# Patient Record
Sex: Female | Born: 1985 | Hispanic: Yes | Marital: Married
Health system: Southern US, Community
[De-identification: ages and names within clinical notes are randomized; demographics above are authoritative.]

## PROBLEM LIST (undated history)

## (undated) ENCOUNTER — Encounter: Attending: Neurology | Primary: Neurology

## (undated) ENCOUNTER — Ambulatory Visit

## (undated) ENCOUNTER — Telehealth
Attending: Student in an Organized Health Care Education/Training Program | Primary: Student in an Organized Health Care Education/Training Program

## (undated) ENCOUNTER — Encounter

## (undated) ENCOUNTER — Telehealth
Attending: Pharmacist Clinician (PhC)/ Clinical Pharmacy Specialist | Primary: Pharmacist Clinician (PhC)/ Clinical Pharmacy Specialist

## (undated) ENCOUNTER — Telehealth

## (undated) ENCOUNTER — Ambulatory Visit: Attending: Physician Assistant | Primary: Physician Assistant

## (undated) ENCOUNTER — Ambulatory Visit: Attending: Internal Medicine | Primary: Internal Medicine

## (undated) ENCOUNTER — Encounter: Attending: Physician Assistant | Primary: Physician Assistant

## (undated) ENCOUNTER — Encounter
Attending: Student in an Organized Health Care Education/Training Program | Primary: Student in an Organized Health Care Education/Training Program

## (undated) ENCOUNTER — Encounter
Attending: Pharmacist Clinician (PhC)/ Clinical Pharmacy Specialist | Primary: Pharmacist Clinician (PhC)/ Clinical Pharmacy Specialist

## (undated) ENCOUNTER — Encounter: Attending: Family Medicine | Primary: Family Medicine

## (undated) ENCOUNTER — Ambulatory Visit
Payer: PRIVATE HEALTH INSURANCE | Attending: Student in an Organized Health Care Education/Training Program | Primary: Student in an Organized Health Care Education/Training Program

## (undated) ENCOUNTER — Encounter: Attending: Internal Medicine | Primary: Internal Medicine

## (undated) ENCOUNTER — Ambulatory Visit
Attending: Pharmacist Clinician (PhC)/ Clinical Pharmacy Specialist | Primary: Pharmacist Clinician (PhC)/ Clinical Pharmacy Specialist

## (undated) ENCOUNTER — Institutional Professional Consult (permissible substitution): Attending: Physician Assistant | Primary: Physician Assistant

## (undated) ENCOUNTER — Ambulatory Visit: Attending: Pharmacist | Primary: Pharmacist

## (undated) ENCOUNTER — Telehealth: Attending: Rheumatology | Primary: Rheumatology

## (undated) ENCOUNTER — Ambulatory Visit: Attending: Professional | Primary: Professional

## (undated) ENCOUNTER — Telehealth: Attending: Physician Assistant | Primary: Physician Assistant

## (undated) ENCOUNTER — Ambulatory Visit: Attending: Neurology | Primary: Neurology

## (undated) ENCOUNTER — Ambulatory Visit: Payer: PRIVATE HEALTH INSURANCE | Attending: Physician Assistant | Primary: Physician Assistant

---
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
## 2004-11-02 ENCOUNTER — Observation Stay: Payer: Self-pay

## 2004-11-03 ENCOUNTER — Inpatient Hospital Stay: Payer: Self-pay

## 2012-02-17 ENCOUNTER — Emergency Department: Payer: Self-pay | Admitting: Emergency Medicine

## 2012-02-17 LAB — COMPREHENSIVE METABOLIC PANEL
BUN: 11 mg/dL (ref 7–18)
Bilirubin,Total: 0.6 mg/dL (ref 0.2–1.0)
Calcium, Total: 8.9 mg/dL (ref 8.5–10.1)
Chloride: 105 mmol/L (ref 98–107)
Co2: 21 mmol/L (ref 21–32)
Creatinine: 0.61 mg/dL (ref 0.60–1.30)
EGFR (African American): >60
EGFR (Non-African Amer.): >60
Glucose: 118 mg/dL — ABNORMAL HIGH (ref 65–99)
Potassium: 4.1 mmol/L (ref 3.5–5.1)
SGOT(AST): 106 U/L — ABNORMAL HIGH (ref 15–37)
SGPT (ALT): 213 U/L — ABNORMAL HIGH (ref 12–78)
Sodium: 137 mmol/L (ref 136–145)
Total Protein: 7.9 g/dL (ref 6.4–8.2)

## 2012-02-17 LAB — CBC
MCH: 30.7 pg (ref 26.0–34.0)
MCHC: 34.2 g/dL (ref 32.0–36.0)
Platelet: 284 10*3/uL (ref 150–440)
RBC: 4.37 10*6/uL (ref 3.80–5.20)
RDW: 12.6 % (ref 11.5–14.5)

## 2012-02-17 LAB — URINALYSIS, COMPLETE
Bacteria: NONE SEEN
Glucose,UR: NEGATIVE mg/dL (ref 0–75)
Leukocyte Esterase: NEGATIVE
Nitrite: NEGATIVE
Protein: NEGATIVE
RBC,UR: 1 /HPF (ref 0–5)
Specific Gravity: 1.002 (ref 1.003–1.030)
WBC UR: <1 /HPF (ref 0–5)

## 2012-02-17 LAB — PREGNANCY, URINE: Pregnancy Test, Urine: NEGATIVE m[IU]/mL

## 2012-03-31 ENCOUNTER — Emergency Department: Payer: Self-pay | Admitting: Emergency Medicine

## 2012-03-31 LAB — CBC
HCT: 35.4 % (ref 35.0–47.0)
HGB: 12.4 g/dL (ref 12.0–16.0)
MCH: 31.6 pg (ref 26.0–34.0)
MCHC: 35.1 g/dL (ref 32.0–36.0)
RBC: 3.94 10*6/uL (ref 3.80–5.20)

## 2012-03-31 LAB — URINALYSIS, COMPLETE
Glucose,UR: NEGATIVE mg/dL (ref 0–75)
Nitrite: NEGATIVE
Ph: 9 (ref 4.5–8.0)
Protein: NEGATIVE
RBC,UR: 1 /HPF (ref 0–5)

## 2012-03-31 LAB — COMPREHENSIVE METABOLIC PANEL
Albumin: 3.8 g/dL (ref 3.4–5.0)
Alkaline Phosphatase: 72 U/L (ref 50–136)
Anion Gap: 8 (ref 7–16)
BUN: 6 mg/dL — ABNORMAL LOW (ref 7–18)
Bilirubin,Total: 0.4 mg/dL (ref 0.2–1.0)
Calcium, Total: 8.7 mg/dL (ref 8.5–10.1)
Chloride: 107 mmol/L (ref 98–107)
Co2: 26 mmol/L (ref 21–32)
Creatinine: 0.64 mg/dL (ref 0.60–1.30)
EGFR (African American): >60
EGFR (Non-African Amer.): >60
Glucose: 99 mg/dL (ref 65–99)
Osmolality: 279 (ref 275–301)
Potassium: 3.7 mmol/L (ref 3.5–5.1)
SGOT(AST): 36 U/L (ref 15–37)
SGPT (ALT): 73 U/L (ref 12–78)
Sodium: 141 mmol/L (ref 136–145)
Total Protein: 7 g/dL (ref 6.4–8.2)

## 2012-03-31 LAB — PREGNANCY, URINE: Pregnancy Test, Urine: POSITIVE m[IU]/mL

## 2012-03-31 LAB — HCG, QUANTITATIVE, PREGNANCY: Beta Hcg, Quant.: 54461 m[IU]/mL — ABNORMAL HIGH

## 2012-06-18 ENCOUNTER — Ambulatory Visit: Payer: Self-pay | Admitting: Family Medicine

## 2012-08-25 ENCOUNTER — Observation Stay: Payer: Self-pay | Admitting: Obstetrics and Gynecology

## 2012-08-25 LAB — URINALYSIS, COMPLETE
Bilirubin,UR: NEGATIVE
Blood: NEGATIVE
Ketone: NEGATIVE
Ph: 7 (ref 4.5–8.0)
Protein: NEGATIVE
RBC,UR: 1 /HPF (ref 0–5)
Specific Gravity: 1.012 (ref 1.003–1.030)
WBC UR: 2 /HPF (ref 0–5)

## 2012-09-24 ENCOUNTER — Observation Stay: Payer: Self-pay | Admitting: Obstetrics and Gynecology

## 2012-10-30 ENCOUNTER — Inpatient Hospital Stay: Payer: Self-pay | Admitting: Obstetrics and Gynecology

## 2012-10-30 LAB — CBC WITH DIFFERENTIAL/PLATELET
Basophil #: 0.1 10*3/uL (ref 0.0–0.1)
Basophil %: 0.9 %
Eosinophil #: 0.2 10*3/uL (ref 0.0–0.7)
Eosinophil %: 2.5 %
HCT: 32 % — ABNORMAL LOW (ref 35.0–47.0)
HGB: 11 g/dL — ABNORMAL LOW (ref 12.0–16.0)
Lymphocyte #: 1.5 10*3/uL (ref 1.0–3.6)
Lymphocyte %: 21.8 %
MCH: 28.9 pg (ref 26.0–34.0)
MCHC: 34.3 g/dL (ref 32.0–36.0)
MCV: 84 fL (ref 80–100)
Neutrophil #: 4.7 10*3/uL (ref 1.4–6.5)
Neutrophil %: 69.4 %
Platelet: 229 10*3/uL (ref 150–440)
RBC: 3.79 10*6/uL — ABNORMAL LOW (ref 3.80–5.20)

## 2012-10-31 LAB — HEMOGLOBIN: HGB: 10.8 g/dL — ABNORMAL LOW (ref 12.0–16.0)

## 2014-03-14 ENCOUNTER — Encounter: Payer: Self-pay | Admitting: Maternal & Fetal Medicine

## 2014-04-30 IMAGING — US US OB < 14 WEEKS
1 series · 14 of 28 positions shown · non-contrast
Comparison: none

REASON FOR EXAM: Abdominal pain, vaginal bleeding
COMMENTS:   May transport without cardiac monitor

[Series 1: us ob < 14 weeks · 0.23mm/px · 14 of 56 slices shown]
[im 3/56]
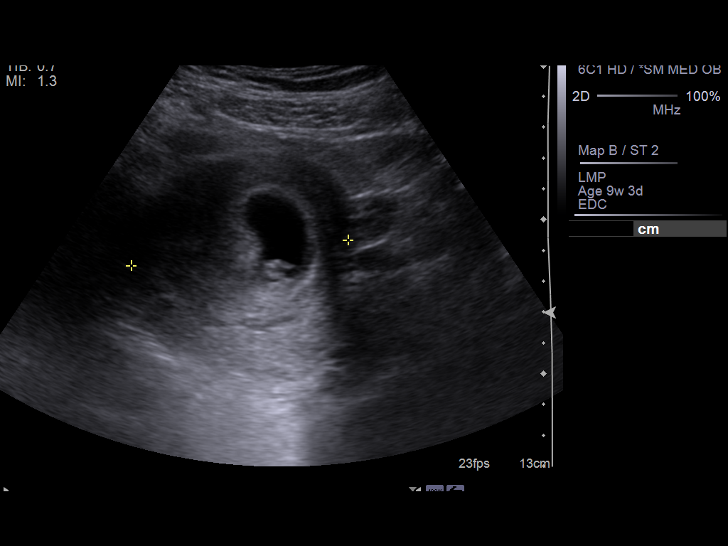
[im 7/56]
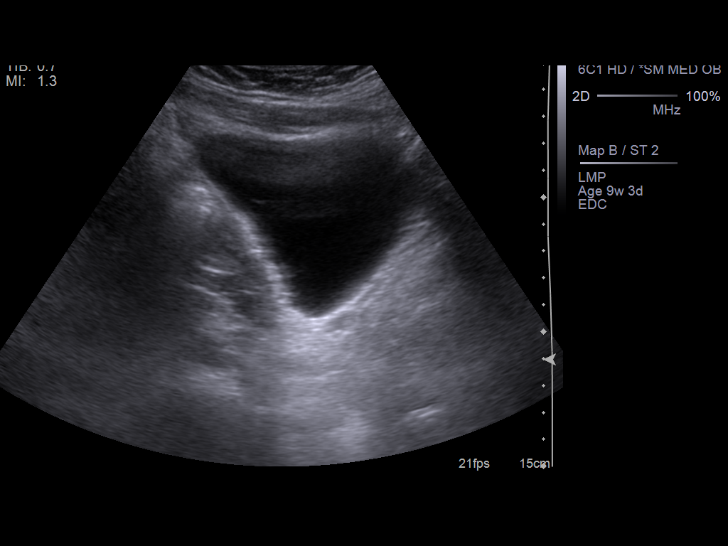
[im 11/56]
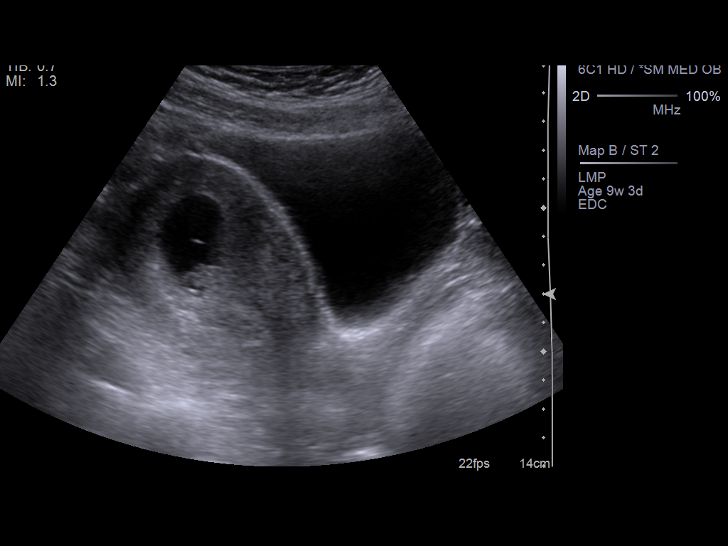
[im 15/56]
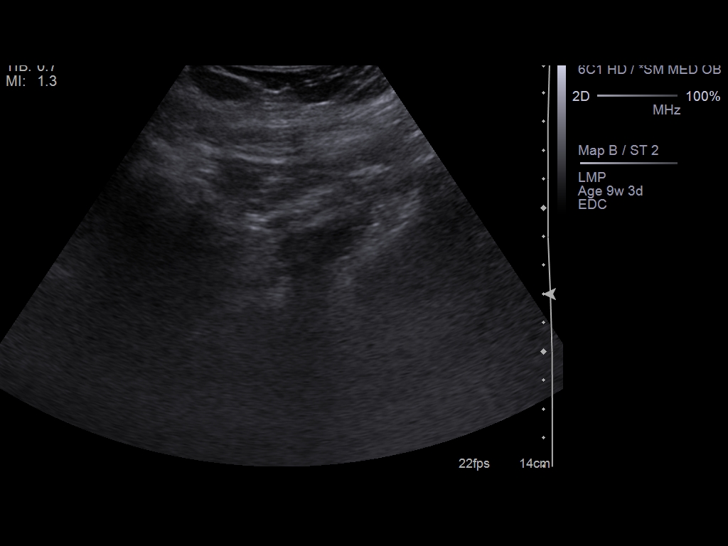
[im 19/56]
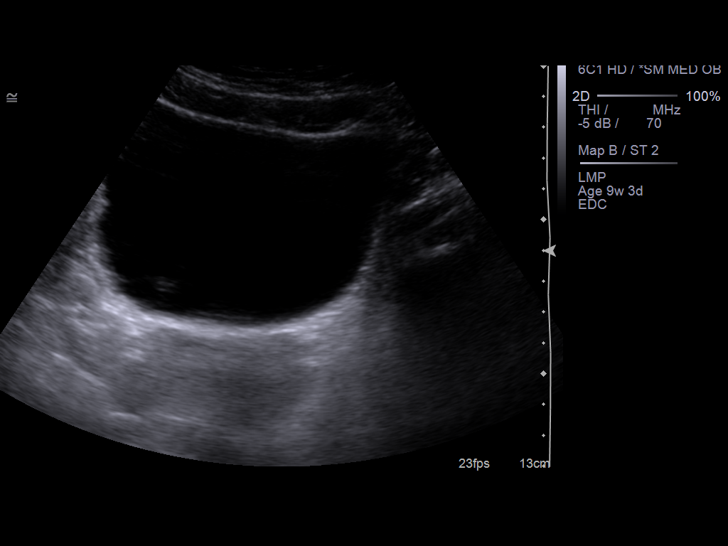
[im 23/56]
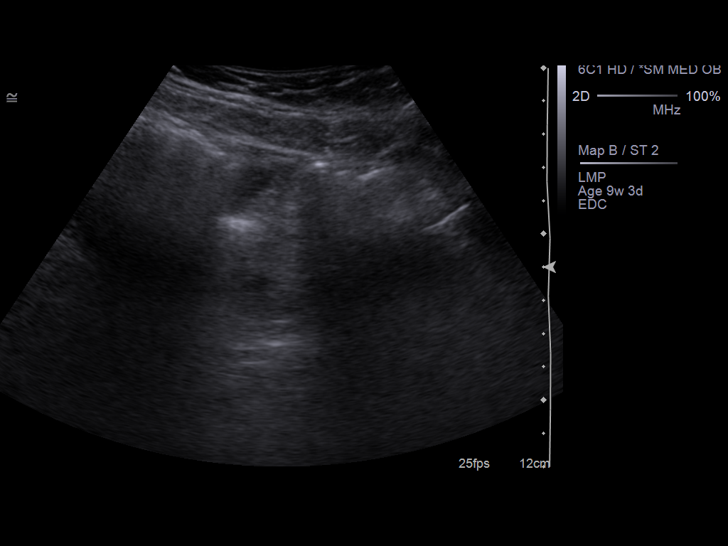
[im 27/56]
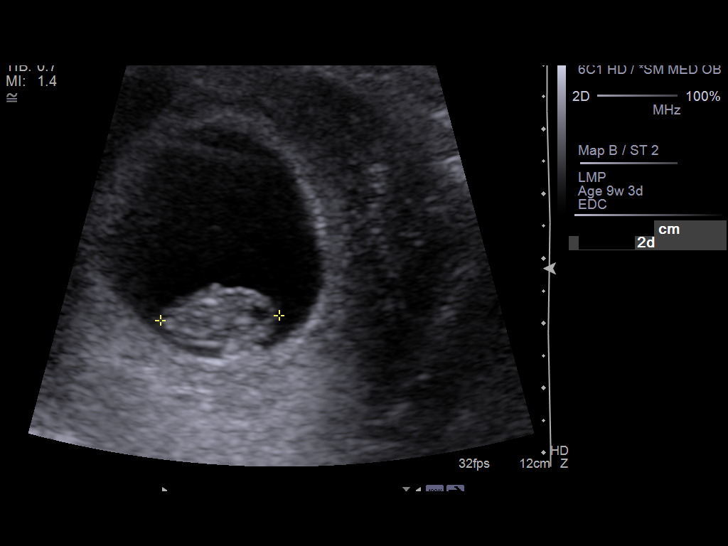
[im 31/56]
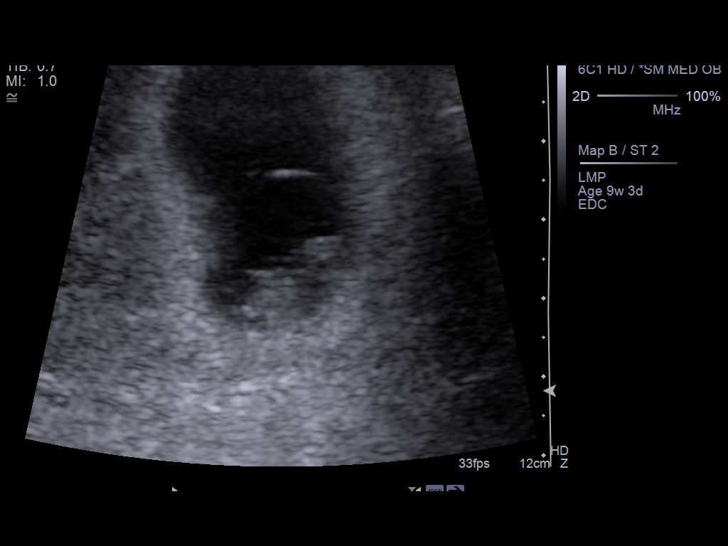
[im 35/56]
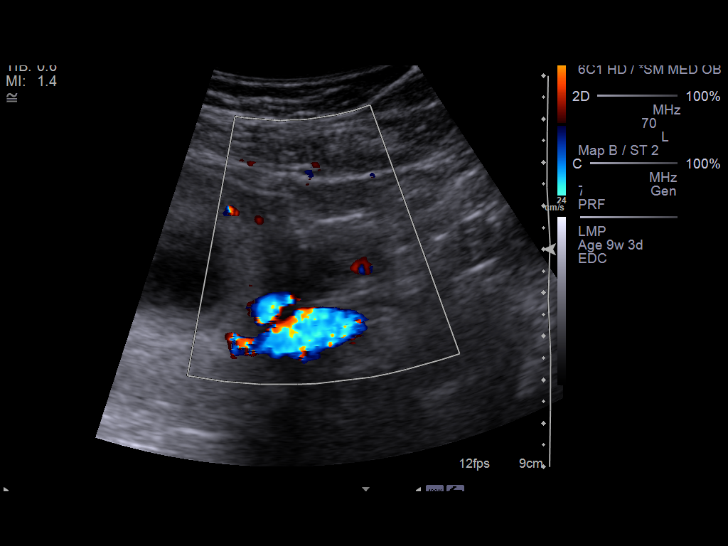
[im 39/56]
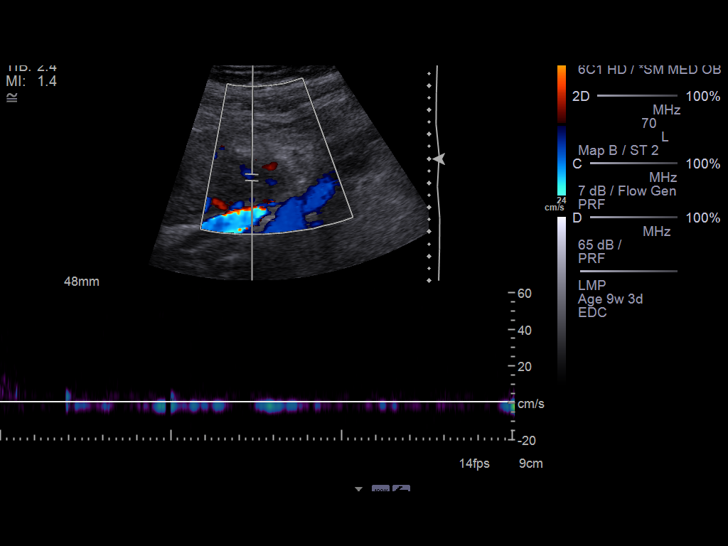
[im 43/56]
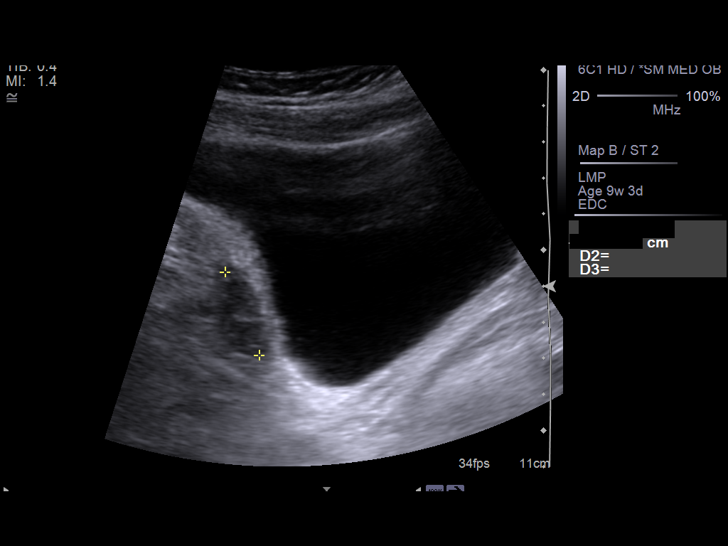
[im 47/56]
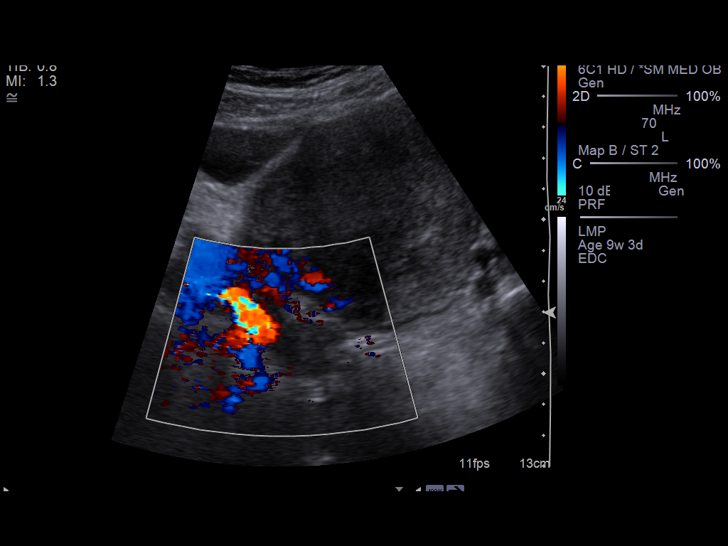
[im 51/56]
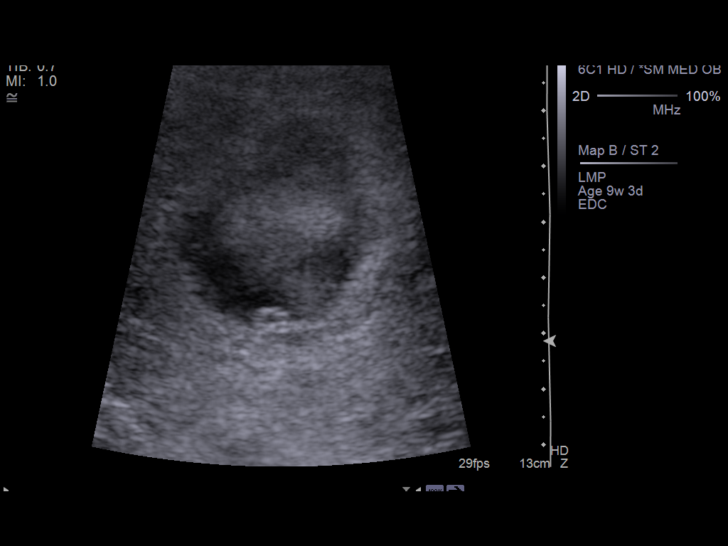
[im 56/56]
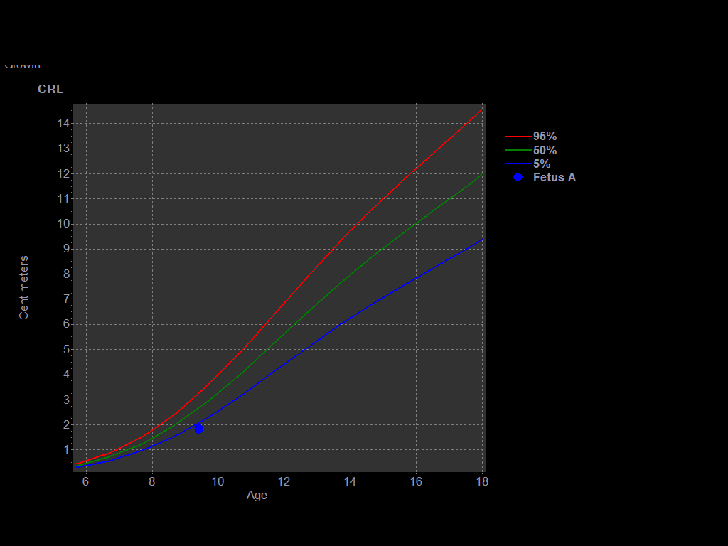

[14 of 28 positions shown; findings below may reference images not displayed]

PROCEDURE:     US  - US OB LESS THAN 14 WEEKS  - March 31, 2012  [DATE]

RESULT:     Single viable intrauterine pregnancy is present with crown-rump
length of 1.8 cm corresponding to 8 weeks 2 days. Fetal heart rate of 162
beats per minute noted. Ovaries are unremarkable. Normal ovarian blood flow.
No free pelvic fluid. No hydronephrosis.
IMPRESSION: Single viable intrauterine pregnancy.

## 2014-05-12 ENCOUNTER — Ambulatory Visit: Payer: Self-pay | Admitting: Advanced Practice Midwife

## 2014-08-16 ENCOUNTER — Ambulatory Visit: Payer: Self-pay | Admitting: Advanced Practice Midwife

## 2014-08-22 ENCOUNTER — Observation Stay: Payer: Self-pay

## 2014-08-24 ENCOUNTER — Ambulatory Visit: Payer: Self-pay | Admitting: Advanced Practice Midwife

## 2014-08-30 ENCOUNTER — Ambulatory Visit
Admit: 2014-08-30 | Disposition: A | Discharge status: Home / Self Care | Payer: Self-pay | Attending: Advanced Practice Midwife | Admitting: Advanced Practice Midwife

## 2014-08-31 ENCOUNTER — Observation Stay: Payer: Self-pay | Admitting: Obstetrics and Gynecology

## 2014-09-07 ENCOUNTER — Observation Stay: Payer: Self-pay | Admitting: Obstetrics and Gynecology

## 2014-09-09 ENCOUNTER — Ambulatory Visit: Payer: Self-pay | Admitting: Obstetrics and Gynecology

## 2014-09-11 ENCOUNTER — Inpatient Hospital Stay: Payer: Self-pay | Admitting: Obstetrics and Gynecology

## 2014-09-12 ENCOUNTER — Ambulatory Visit
Admit: 2014-09-12 | Disposition: A | Discharge status: Home / Self Care | Payer: Self-pay | Admitting: Obstetrics and Gynecology

## 2014-10-21 NOTE — Op Note (Signed)
PATIENT NAME:  Julie SabalSTEBAN Vargas, Julie Vargas MR#:  [id number] DATE OF BIRTH:  [birth year]  DATE OF PROCEDURE:  10/30/2012  DIAGNOSIS:  Severe shoulder dystocia.  PROCEDURE:  Vaginal delivery with dystocia maneuvers.   HISTORY OF PRESENT ILLNESS: A 29 year old gravida 2, para 1 patient at 3538 plus 5 weeks admitted to labor and delivery in active labor. Ultrasound the day of the procedure documented the fetus to be less than 4000 grams estimated fetal weight based on ultrasound performed the day of delivery. The patient labored adequately and entered second stage of labor. After approximately one hour of pushing, head was to the outlet presentation. A Kiwi vacuum was applied to the occiput after explaining the procedure to the patient and her husband. This was done through the translator. Delivery of the head was accomplished after 2 pulls of the vacuum, one pop-off and then a gentle second pull allowed for delivery of the head. A large fetal head then was noted to be stuck on the perineum. Pushing stopped while nursing performed a suprapubic pressure maneuver, legs were flexed with the McRoberts maneuver and multiple attempts to deliver the anterior shoulder was unsuccessful. Ultimately, a Wood's corkscrew maneuver aided in the ultimate delivery of the anterior shoulder. A small pop was heard while delivering the anterior shoulder and after delivery of a large floppy baby, nursery staff assigned Apgar scores of 5 and 9. There was a depression in the right clavicle consistent with a clavicular fracture. The baby was not moving the right arm willingly.  I explained the issues with the delivery, the maneuvers and possible fracture of the clavicle and transient neurologic condition to the patient and her spouse promptly after the delivery. The placenta delivered without difficulty and rest of the postpartum course was uneventful. The patient did receive 600 mcg of rectal Cytotec for mild uterine atony given that she had  lost IV access. The patient did receive 20 units of Pitocin intramuscular as well.  A second-degree laceration was repaired with 2-0 Vicryl and 3-0 Vicryl. The patient tolerated this well.    ____________________________ Suzy Bouchardhomas J. Brien Lowe, MD tjs:ce D: 10/30/2012 20:19:19 ET T: 10/31/2012 10:42:59 ET JOB#: [id number]  cc: Suzy Bouchardhomas J. Leron Stoffers, MD, <Dictator> Suzy BouchardHOMAS J Helem Reesor MD ELECTRONICALLY SIGNED 11/02/2012 9:28

## 2014-10-24 LAB — SURGICAL PATHOLOGY

## 2014-10-30 NOTE — Op Note (Signed)
PATIENT NAME:  Julie Vargas, Julie Vargas MR#:  [id number] DATE OF BIRTH:  07/01/1986  DATE OF PROCEDURE:  09/11/2014  PREOPERATIVE DIAGNOSES:  1.  Gestational diabetes A2, poorly controlled with an estimated fetal weight in the 96th percentile.  2.  Prior history of severe shoulder dystocia with fractured clavicle.  3.  Active labor.  4.  Rh+, group B streptococcus negative.   POSTOPERATIVE DIAGNOSES:  1.  Gestational diabetes A2, poorly controlled with an estimated fetal weight in the 96th percentile.  2.  Prior history of severe shoulder dystocia with fractured clavicle.  3.  Active labor.  4.  Rh+, group B streptococcus negative. 5.  Meconium-stained fluid.  6.  Viable female infant.   PROCEDURE: Primary low transverse cesarean section through a Pfannenstiel skin incision with placement of the On-Q pain pump.   SURGEON: Cline Cools, MD  ASSISTANT: Sharee Pimple, CNM    ANESTHESIA: General endotracheal anesthesia.   ESTIMATED BLOOD LOSS: 1000 mL.   INTRAVENOUS FLUIDS: 1800 mL.   URINE OUTPUT: 100 mL.   COMPLICATIONS OF PROCEDURE: None.    CONDITION: Stable to PACU.   INDICATION FOR PROCEDURE: The patient is a 29 year old gravida 3, para 2, 0, 0, 2, who presented at 38-4/7 weeks' estimated gestational age in active labor. She was 6 cm upon initial check. She has a history of gestational diabetes on glyburide but poorly controlled during this pregnancy with an estimated fetal weight recently up in the 96th percentile. She also has a history of prior vaginal delivery complicated by severe shoulder dystocia requiring clavicle fracture of the baby. For this reason, the plan was a scheduled cesarean section tomorrow morning under regional anesthesia; however, the patient did present today in active labor. After her initial exam, she was placed on a monitor and an urgent cesarean section was called. While the preparations were ensuing for her section, she was rechecked because of her  discomfort and found to be 9 cm. The urgency of the cesarean section did increase and the team hurried the patient back to the operating room, Of note, the patient is Spanish-speaking only. A certified medical interpreter was with the patient during these decisions and she did consent, understanding that we did not have time for an upright placement of regional anesthesia and that general anesthesia would be called for. The risks and benefits of this procedure had been previously discussed with the patient and she did accept them again, verbally at this time.   PROCEDURE: The patient was taken to the operating room, where she was identified by name and birthdate. She was placed on the operating table in supine position and prepped and draped in the usual sterile fashion. A formal timeout procedure was performed with all team members present and in agreement.   General endotracheal anesthesia was induced without difficulty; 2 grams of Ancef for prophylactic antibiotics were given prior to the start of the procedure. A Pfannenstiel skin incision was made 2 fingerbreadths above the umbilicus just after her general endotracheal anesthesia was induced. This was taken sharply down to the fascia, which was nicked in the midline and stretched laterally. The rectus muscles were then entered in the midline bluntly. The peritoneal cavity was entered in the midline bluntly. These layers were all grasped and stretched laterally to either side. A normally-sized uterus with normal tubes and ovaries were noted. Hysterotomy was performed and the membranes were ruptured for meconium-stained fluid. The fetal vertex was brought to the hysterotomy using fundal pressure. The  baby was delivered atraumatically. The cord was doubly clamped and cut and the baby was passed off to awaiting NICU staff. A 3-vessel cord was noted.   The placenta was manually extracted and the uterus was exteriorized. The uterus was cleared of all clots and  debris. Uterine atony was noted and 0.2 mg Methergine were given intramuscularly; 40 units of postpartum Pitocin was also hung. Vigorous bimanual massage was performed as the uterus was cleared of clots and debris and the hysterotomy was then closed carefully in a running locked fashion with 0 Vicryl suture. A second imbricating layer was required to assure hemostasis. Several extra figure-of-eight sutures were placed for hemostasis. It was noted that the hysterotomy appeared to be higher than expected given her advanced labor and for this reason it is not recommended that she labor again on this scar.   Interceed adhesion barrier was placed across the hysterotomy. The paracolic gutters were cleared of all clots and debris. The peritoneal cavity was cleared of clots and debris. Attention was returned to the hysterotomy, which was noted to be hemostatic and the tagged angles were cut. The fascia and the rectus muscles were hemostatic. The On-Q pump was placed with 2 catheters infraumbilically curled below the fascia. The fascia was then closed in a running fashion using 0 Vicryl suture taking care not to incorporate from underlying muscle or On-Q pump. The subcutaneous tissue was irrigated and then closed 3-0 Vicryl for a loose reapproximation, as it was greater than 2 cm thick. The skin was closed carefully with staples and a pressure dressing was placed. The On-Q pump catheters were curled under a Tegaderm and instilled with a total of 10 mL of 1% lidocaine. The general endotracheal anesthesia was reversed without difficulty and the patient is awake and stable in PACU.    ____________________________ Cline CoolsBethany E. Mikaya Bunner, MD beb:bm D: 09/11/2014 14:22:10 ET T: 09/11/2014 23:18:07 ET JOB#: [id number]  cc: Cline CoolsBethany E. Porsche Noguchi, MD, <Dictator> Cline CoolsBETHANY E Cheick Suhr MD ELECTRONICALLY SIGNED 10/04/2014 17:41

## 2017-04-03 ENCOUNTER — Ambulatory Visit
Admission: RE | Admit: 2017-04-03 | Discharge: 2017-04-03 | Disposition: A | Discharge status: Home / Self Care | Payer: MEDICAID

## 2017-04-03 DIAGNOSIS — H4710 Unspecified papilledema: Principal | ICD-10-CM

## 2017-04-04 ENCOUNTER — Inpatient Hospital Stay
Admission: EM | Admit: 2017-04-04 | Discharge: 2017-04-06 | Disposition: A | Discharge status: Home / Self Care | Payer: MEDICAID | Source: Intra-hospital

## 2017-04-04 ENCOUNTER — Inpatient Hospital Stay
Admission: EM | Admit: 2017-04-04 | Discharge: 2017-04-06 | Disposition: A | Discharge status: Home / Self Care | Payer: MEDICAID | Source: Intra-hospital | Admitting: Internal Medicine

## 2017-04-05 DIAGNOSIS — H5713 Ocular pain, bilateral: Principal | ICD-10-CM

## 2017-05-21 ENCOUNTER — Ambulatory Visit
Admission: RE | Admit: 2017-05-21 | Discharge: 2017-05-21 | Disposition: A | Discharge status: Home / Self Care | Payer: MEDICAID | Attending: Neurology | Admitting: Neurology

## 2017-05-21 DIAGNOSIS — E559 Vitamin D deficiency, unspecified: Secondary | ICD-10-CM

## 2017-05-21 DIAGNOSIS — H469 Unspecified optic neuritis: Principal | ICD-10-CM

## 2017-05-21 DIAGNOSIS — R9089 Other abnormal findings on diagnostic imaging of central nervous system: Secondary | ICD-10-CM

## 2017-05-21 MED ORDER — ERGOCALCIFEROL (VITAMIN D2) 1,250 MCG (50,000 UNIT) CAPSULE
ORAL_CAPSULE | ORAL | 0 refills | 0.00000 days | Status: CP
Start: 2017-05-21 — End: 2017-08-15

## 2017-06-27 ENCOUNTER — Ambulatory Visit
Admission: RE | Admit: 2017-06-27 | Discharge: 2017-06-27 | Disposition: A | Discharge status: Home / Self Care | Payer: MEDICAID | Attending: Neurology | Admitting: Neurology

## 2017-06-27 DIAGNOSIS — H469 Unspecified optic neuritis: Secondary | ICD-10-CM

## 2017-06-27 DIAGNOSIS — R51 Headache: Secondary | ICD-10-CM

## 2017-06-27 DIAGNOSIS — E559 Vitamin D deficiency, unspecified: Principal | ICD-10-CM

## 2017-07-10 ENCOUNTER — Ambulatory Visit: Admit: 2017-07-10 | Discharge: 2017-07-10

## 2017-07-10 DIAGNOSIS — R51 Headache: Principal | ICD-10-CM

## 2017-07-10 DIAGNOSIS — H469 Unspecified optic neuritis: Principal | ICD-10-CM

## 2017-08-06 ENCOUNTER — Ambulatory Visit: Admit: 2017-08-06 | Discharge: 2017-08-07 | Attending: Neurology | Primary: Neurology

## 2017-08-06 DIAGNOSIS — R945 Abnormal results of liver function studies: Secondary | ICD-10-CM

## 2017-08-06 DIAGNOSIS — E559 Vitamin D deficiency, unspecified: Secondary | ICD-10-CM

## 2017-08-06 DIAGNOSIS — R9089 Other abnormal findings on diagnostic imaging of central nervous system: Secondary | ICD-10-CM

## 2017-08-06 DIAGNOSIS — H469 Unspecified optic neuritis: Principal | ICD-10-CM

## 2017-08-15 ENCOUNTER — Encounter: Admit: 2017-08-15 | Discharge: 2017-08-16 | Payer: MEDICARE | Attending: Internal Medicine | Primary: Internal Medicine

## 2017-08-15 ENCOUNTER — Ambulatory Visit: Admit: 2017-08-15 | Discharge: 2017-08-16

## 2017-08-15 DIAGNOSIS — R945 Abnormal results of liver function studies: Secondary | ICD-10-CM

## 2017-08-15 DIAGNOSIS — H469 Unspecified optic neuritis: Secondary | ICD-10-CM

## 2017-08-15 DIAGNOSIS — E559 Vitamin D deficiency, unspecified: Principal | ICD-10-CM

## 2017-08-16 ENCOUNTER — Encounter: Admit: 2017-08-16 | Discharge: 2017-08-17 | Payer: MEDICARE

## 2017-08-16 DIAGNOSIS — R945 Abnormal results of liver function studies: Principal | ICD-10-CM

## 2017-08-19 ENCOUNTER — Ambulatory Visit: Admit: 2017-08-19 | Discharge: 2017-08-20 | Payer: MEDICARE

## 2017-08-19 DIAGNOSIS — R945 Abnormal results of liver function studies: Principal | ICD-10-CM

## 2017-08-25 MED ORDER — CHOLECALCIFEROL (VITAMIN D3) 100 MCG (4,000 UNIT) CAPSULE
ORAL_CAPSULE | Freq: Every day | ORAL | 11 refills | 0 days | Status: CP
Start: 2017-08-25 — End: 2017-08-15

## 2017-09-03 ENCOUNTER — Ambulatory Visit: Admit: 2017-09-03 | Discharge: 2017-09-04

## 2017-09-03 DIAGNOSIS — R945 Abnormal results of liver function studies: Principal | ICD-10-CM

## 2017-09-03 DIAGNOSIS — K760 Fatty (change of) liver, not elsewhere classified: Secondary | ICD-10-CM

## 2017-09-03 DIAGNOSIS — H469 Unspecified optic neuritis: Secondary | ICD-10-CM

## 2017-11-11 ENCOUNTER — Ambulatory Visit: Admit: 2017-11-11 | Discharge: 2017-11-12 | Attending: Neurology | Primary: Neurology

## 2017-11-11 DIAGNOSIS — E559 Vitamin D deficiency, unspecified: Secondary | ICD-10-CM

## 2017-11-11 DIAGNOSIS — H469 Unspecified optic neuritis: Principal | ICD-10-CM

## 2017-11-11 DIAGNOSIS — R9089 Other abnormal findings on diagnostic imaging of central nervous system: Secondary | ICD-10-CM

## 2017-11-11 DIAGNOSIS — R945 Abnormal results of liver function studies: Secondary | ICD-10-CM

## 2017-11-17 MED ORDER — GLATIRAMER 40 MG/ML SUBCUTANEOUS SYRINGE
INJECTION | SUBCUTANEOUS | 5 refills | 0.00000 days
Start: 2017-11-17 — End: 2018-08-18

## 2017-11-21 ENCOUNTER — Ambulatory Visit: Admit: 2017-11-21 | Discharge: 2017-11-21

## 2017-11-21 ENCOUNTER — Ambulatory Visit: Admit: 2017-11-21 | Discharge: 2017-11-21 | Attending: Internal Medicine | Primary: Internal Medicine

## 2017-11-21 DIAGNOSIS — R945 Abnormal results of liver function studies: Principal | ICD-10-CM

## 2017-11-21 DIAGNOSIS — H469 Unspecified optic neuritis: Secondary | ICD-10-CM

## 2017-12-26 ENCOUNTER — Ambulatory Visit
Admit: 2017-12-26 | Discharge: 2017-12-27 | Attending: Pharmacist Clinician (PhC)/ Clinical Pharmacy Specialist | Primary: Pharmacist Clinician (PhC)/ Clinical Pharmacy Specialist

## 2017-12-26 DIAGNOSIS — H469 Unspecified optic neuritis: Principal | ICD-10-CM

## 2018-02-13 ENCOUNTER — Ambulatory Visit: Admit: 2018-02-13 | Discharge: 2018-02-14 | Attending: Neurology | Primary: Neurology

## 2018-02-13 DIAGNOSIS — Z8669 Personal history of other diseases of the nervous system and sense organs: Secondary | ICD-10-CM

## 2018-02-13 DIAGNOSIS — R945 Abnormal results of liver function studies: Secondary | ICD-10-CM

## 2018-02-13 DIAGNOSIS — M792 Neuralgia and neuritis, unspecified: Secondary | ICD-10-CM

## 2018-02-13 DIAGNOSIS — G379 Demyelinating disease of central nervous system, unspecified: Principal | ICD-10-CM

## 2018-02-13 DIAGNOSIS — E559 Vitamin D deficiency, unspecified: Secondary | ICD-10-CM

## 2018-02-13 MED ORDER — GABAPENTIN 300 MG CAPSULE
ORAL_CAPSULE | Freq: Every evening | ORAL | 5 refills | 0.00000 days | Status: CP
Start: 2018-02-13 — End: 2018-08-18

## 2018-04-01 ENCOUNTER — Ambulatory Visit: Admit: 2018-04-01 | Discharge: 2018-04-02

## 2018-04-01 DIAGNOSIS — R945 Abnormal results of liver function studies: Principal | ICD-10-CM

## 2018-04-01 DIAGNOSIS — H469 Unspecified optic neuritis: Secondary | ICD-10-CM

## 2018-04-17 ENCOUNTER — Ambulatory Visit: Admit: 2018-04-17 | Discharge: 2018-04-18

## 2018-04-17 DIAGNOSIS — R945 Abnormal results of liver function studies: Principal | ICD-10-CM

## 2018-05-07 ENCOUNTER — Ambulatory Visit: Admit: 2018-05-07 | Discharge: 2018-05-08

## 2018-05-07 DIAGNOSIS — R945 Abnormal results of liver function studies: Principal | ICD-10-CM

## 2018-05-07 DIAGNOSIS — M2550 Pain in unspecified joint: Secondary | ICD-10-CM

## 2018-05-11 ENCOUNTER — Encounter: Admit: 2018-05-11 | Discharge: 2018-05-11 | Payer: MEDICARE | Attending: Internal Medicine | Primary: Internal Medicine

## 2018-05-11 DIAGNOSIS — R945 Abnormal results of liver function studies: Principal | ICD-10-CM

## 2018-08-18 ENCOUNTER — Ambulatory Visit: Admit: 2018-08-18 | Discharge: 2018-08-19 | Attending: Neurology | Primary: Neurology

## 2018-08-18 DIAGNOSIS — R5382 Chronic fatigue, unspecified: Secondary | ICD-10-CM

## 2018-08-18 DIAGNOSIS — G35 Multiple sclerosis: Principal | ICD-10-CM

## 2018-08-18 DIAGNOSIS — H469 Unspecified optic neuritis: Secondary | ICD-10-CM

## 2018-08-18 DIAGNOSIS — M792 Neuralgia and neuritis, unspecified: Secondary | ICD-10-CM

## 2018-08-18 DIAGNOSIS — R945 Abnormal results of liver function studies: Secondary | ICD-10-CM

## 2018-08-18 MED ORDER — GABAPENTIN 300 MG CAPSULE
ORAL_CAPSULE | Freq: Every evening | ORAL | 5 refills | 0 days | Status: CP
Start: 2018-08-18 — End: ?

## 2018-08-19 MED ORDER — GLATIRAMER 40 MG/ML SUBCUTANEOUS SYRINGE
INJECTION | SUBCUTANEOUS | 5 refills | 0.00000 days
Start: 2018-08-19 — End: ?

## 2018-08-22 MED ORDER — AMANTADINE HCL 100 MG CAPSULE
ORAL_CAPSULE | Freq: Every morning | ORAL | 5 refills | 0.00000 days | Status: CP
Start: 2018-08-22 — End: ?

## 2018-09-04 ENCOUNTER — Ambulatory Visit: Admit: 2018-09-04 | Discharge: 2018-09-05

## 2018-09-04 DIAGNOSIS — G35 Multiple sclerosis: Principal | ICD-10-CM

## 2018-10-21 ENCOUNTER — Encounter: Admit: 2018-10-21 | Discharge: 2018-10-21 | Disposition: A | Discharge status: Home / Self Care | Payer: MEDICARE

## 2018-10-21 DIAGNOSIS — M25552 Pain in left hip: Secondary | ICD-10-CM

## 2018-10-21 DIAGNOSIS — M79605 Pain in left leg: Principal | ICD-10-CM

## 2018-10-21 MED ORDER — HYDROCODONE 5 MG-ACETAMINOPHEN 325 MG TABLET
ORAL_TABLET | Freq: Four times a day (QID) | ORAL | 0 refills | 0 days | Status: CP | PRN
Start: 2018-10-21 — End: 2018-10-26

## 2018-10-21 MED ORDER — IBUPROFEN 600 MG TABLET: 600 mg | tablet | Freq: Four times a day (QID) | 0 refills | 0 days | Status: AC

## 2018-10-21 MED ORDER — PREDNISONE 20 MG TABLET
ORAL_TABLET | Freq: Every day | ORAL | 0 refills | 0 days | Status: CP
Start: 2018-10-21 — End: 2018-10-26

## 2018-10-21 MED ORDER — IBUPROFEN 600 MG TABLET
ORAL_TABLET | Freq: Four times a day (QID) | ORAL | 0 refills | 0.00000 days | Status: CP | PRN
Start: 2018-10-21 — End: 2018-10-31

## 2018-11-06 ENCOUNTER — Ambulatory Visit: Admit: 2018-11-06 | Discharge: 2018-11-07

## 2018-11-06 DIAGNOSIS — K7581 Nonalcoholic steatohepatitis (NASH): Principal | ICD-10-CM

## 2018-11-19 ENCOUNTER — Ambulatory Visit: Admit: 2018-11-19 | Discharge: 2018-11-19

## 2018-11-19 ENCOUNTER — Telehealth: Admit: 2018-11-19 | Discharge: 2018-11-19 | Attending: Rheumatology | Primary: Rheumatology

## 2018-11-19 DIAGNOSIS — M2550 Pain in unspecified joint: Principal | ICD-10-CM

## 2018-11-19 DIAGNOSIS — R945 Abnormal results of liver function studies: Secondary | ICD-10-CM

## 2018-11-19 MED ORDER — CELECOXIB 200 MG CAPSULE
ORAL_CAPSULE | Freq: Every day | ORAL | 4 refills | 0.00000 days | Status: CP | PRN
Start: 2018-11-19 — End: ?

## 2019-03-16 DIAGNOSIS — H469 Unspecified optic neuritis: Secondary | ICD-10-CM

## 2019-03-17 MED ORDER — GLATIRAMER 40 MG/ML SUBCUTANEOUS SYRINGE
INJECTION | SUBCUTANEOUS | 5 refills | 0 days
Start: 2019-03-17 — End: ?

## 2019-05-05 ENCOUNTER — Institutional Professional Consult (permissible substitution): Admit: 2019-05-05 | Discharge: 2019-05-06 | Attending: Rheumatology | Primary: Rheumatology

## 2019-05-05 DIAGNOSIS — M2550 Pain in unspecified joint: Principal | ICD-10-CM

## 2019-05-05 MED ORDER — CELECOXIB 200 MG CAPSULE
ORAL_CAPSULE | Freq: Every day | ORAL | 6 refills | 30.00000 days | Status: CP | PRN
Start: 2019-05-05 — End: ?

## 2019-06-21 DIAGNOSIS — G35 Multiple sclerosis: Principal | ICD-10-CM

## 2019-06-21 DIAGNOSIS — R5382 Chronic fatigue, unspecified: Principal | ICD-10-CM

## 2019-06-21 DIAGNOSIS — M792 Neuralgia and neuritis, unspecified: Principal | ICD-10-CM

## 2019-06-23 MED ORDER — GABAPENTIN 300 MG CAPSULE
ORAL_CAPSULE | Freq: Every evening | ORAL | 0 refills | 90.00000 days | Status: CP
Start: 2019-06-23 — End: ?

## 2019-06-23 MED ORDER — AMANTADINE HCL 100 MG CAPSULE
ORAL_CAPSULE | Freq: Every morning | ORAL | 2 refills | 30 days | Status: CP
Start: 2019-06-23 — End: ?

## 2019-08-13 ENCOUNTER — Institutional Professional Consult (permissible substitution): Admit: 2019-08-13 | Discharge: 2019-08-14 | Attending: Physician Assistant | Primary: Physician Assistant

## 2019-08-13 DIAGNOSIS — H469 Unspecified optic neuritis: Principal | ICD-10-CM

## 2019-08-13 MED ORDER — GLATIRAMER 40 MG/ML SUBCUTANEOUS SYRINGE
SUBCUTANEOUS | 5 refills | 28.00000 days
Start: 2019-08-13 — End: ?

## 2019-08-13 MED ORDER — GABAPENTIN 300 MG CAPSULE
ORAL_CAPSULE | Freq: Every evening | ORAL | 1 refills | 90.00000 days | Status: CP
Start: 2019-08-13 — End: ?

## 2019-08-13 MED ORDER — AMANTADINE HCL 100 MG CAPSULE
ORAL_CAPSULE | Freq: Every morning | ORAL | 1 refills | 90 days | Status: CP
Start: 2019-08-13 — End: ?

## 2019-09-14 ENCOUNTER — Ambulatory Visit: Admit: 2019-09-14 | Discharge: 2019-09-15

## 2019-10-27 ENCOUNTER — Ambulatory Visit: Admit: 2019-10-27 | Discharge: 2019-10-28 | Attending: Rheumatology | Primary: Rheumatology

## 2019-10-27 DIAGNOSIS — M792 Neuralgia and neuritis, unspecified: Principal | ICD-10-CM

## 2019-10-27 DIAGNOSIS — M2550 Pain in unspecified joint: Principal | ICD-10-CM

## 2019-10-27 MED ORDER — GABAPENTIN 300 MG CAPSULE
ORAL_CAPSULE | Freq: Every evening | ORAL | 3 refills | 90 days | Status: CP
Start: 2019-10-27 — End: ?

## 2019-11-04 ENCOUNTER — Ambulatory Visit: Admit: 2019-11-04 | Discharge: 2019-11-05

## 2019-11-04 DIAGNOSIS — M2550 Pain in unspecified joint: Principal | ICD-10-CM

## 2019-11-04 DIAGNOSIS — Z0000 Encounter for general adult medical examination without abnormal findings: Principal | ICD-10-CM

## 2019-11-04 DIAGNOSIS — M792 Neuralgia and neuritis, unspecified: Principal | ICD-10-CM

## 2019-11-04 MED ORDER — GABAPENTIN 300 MG CAPSULE
ORAL_CAPSULE | Freq: Every evening | ORAL | 3 refills | 90.00000 days | Status: CP
Start: 2019-11-04 — End: ?

## 2019-11-05 MED ORDER — GLIPIZIDE 10 MG TABLET
ORAL_TABLET | Freq: Every day | ORAL | 11 refills | 30 days | Status: CP
Start: 2019-11-05 — End: 2020-11-04

## 2019-11-05 MED ORDER — METFORMIN 500 MG TABLET
ORAL_TABLET | 11 refills | 0 days | Status: CP
Start: 2019-11-05 — End: ?

## 2020-04-03 DIAGNOSIS — G379 Demyelinating disease of central nervous system, unspecified: Principal | ICD-10-CM

## 2020-04-11 DIAGNOSIS — H469 Unspecified optic neuritis: Principal | ICD-10-CM

## 2020-04-13 NOTE — Unmapped (Signed)
[facility] Specialty Medication Onboarding    Specialty Medication: GLATIRAMER  Prior Authorization: Not Required   Financial Assistance: Yes - manufacture assistance approved via copay card or RX [employer]UGS (1102 bucket) as primary   Final Copay/Day Supply: $0 / 28    Insurance Restrictions: None     Notes to Pharmacist: Bill to 1102    The triage team has completed the benefits investigation and has determined that the patient is able to fill this medication at [facility]. Please contact the patient to complete the onboarding or follow up with the prescribing physician as needed.

## 2020-04-13 NOTE — Unmapped (Signed)
Refilled Copaxone for one month.  Past due , staff asked to schedule appt., message/ letter sent to patient.

## 2020-04-14 MED ORDER — GLATIRAMER 40 MG/ML SUBCUTANEOUS SYRINGE
SUBCUTANEOUS | 0 refills | 28 days | Status: CP
Start: 2020-04-14 — End: ?
  Filled 2020-05-01: qty 12, 28d supply, fill #0

## 2020-04-14 NOTE — Unmapped (Signed)
The patient declined counseling on medication administration, missed dose instructions, goals of therapy, side effects and monitoring parameters, warnings and precautions, drug/food interactions and storage, handling precautions, and disposal because they have taken the medication previously. The information in the declined sections below are for informational purposes only and was not discussed with patient.       [facility]   Patient Onboarding/Medication Counseling    [name] is a 34 y.o. female with multiple sclerosis who I am counseling today on continuation of therapy.  I am speaking to the patient.    Was a Nurse, learning disability used for this call? Yes, Spanish. Patient language is appropriate in [facility]    Verified patient's date of birth / HIPAA.    Specialty medication(s) to be sent: Neurology: glatiramer      Non-specialty medications/supplies to be sent: none      Medications not needed at this time: n/a         Copaxone (glatiramer acetate)    Medication & Administration     Dosage: Inject 1 syringe (40mg ) under the skin three times weekly.    Administration: Allow syringe to stand at room temperature for 20 minutes prior to injection, then inject under the skin of arms, abdomen, hips or thighs. Rotate sites. For 3 times weekly injection, administer medication on the same 3 days every week, at least 48 hours apart.  ??? [name] has chosen every MWF as their dosing schedule  ??? Instructions for injection:  o Remove one blister pack with syringe from the box and let stand at room temperature.  o With clean, dry hands examine the syringe. Air bubbles may be present and are normal. The liquid should be clear and colorless or slightly yellowish. If the liquid is cloudy or you see particles do not use this syringe and dispose of it in your sharps container.  o Choose your injection area and clean thoroughly with an alcohol swab; allow to air dry. (remember to rotate sites ??? do not use the same site more than once in a week)  o Holding the syringe like a pencil, remove the needle cover and discard.  o Pinch a 2 inch fold of skin between your thumb and index finger.  o Rest the heel of your hand against your skin and insert the syringe at a 90 degree angle straight into your skin.  o Once fully inserted you may release the pinch of skin.  o Hold the syringe steady and slowly depress the plunger.  o Once you have injected all of the medication you may remove the needle from your skin and dispose of the syringe in your sharps container. Do not attempt to recap the syringe.  o There may be a small amount of blood at the injection site, use a clean, dry cotton ball and press gently to the site.    Adherence/Missed dose instructions:   ??? For once daily dosing:  o Take a missed dose as soon as you remember it. If it is close to the time of your next dose, skip the dose and resume your normal schedule. Do not take 2 doses at once.   ??? For three times weekly dosing:  o Skip the missed dose and resume your normal schedule.     Goals of Therapy     Prevent or reduce the frequency of flare-ups.    Side Effects & Monitoring Parameters   ??? Injection site reaction  ??? Upset stomach/vomiting  ???  Fatigue  ??? Back pain  ??? Headache    The following side effects should be reported to the provider:  ??? Signs of an allergic reaction  ??? Signs of infection (fever, chills, sore throat, ear or sinus pain, new or worsening cough, pain with urination, mouth sores or a wound that won't heal)  ??? Chest pain or pressure  ??? Abnormal heart beat  ??? Dizziness or passing out  ??? Flushing  ??? Excessive sweating  ??? Anxiety  ??? Shortness of breath  ??? Swollen glands  ??? Swelling  ??? Pain, color or temperature change, or an indent at the injection site      Contraindications, Warnings & Precautions     ??? Hypersensitivity  ??? Lipoatrophy (rotate injection sites)    Drug/Food Interactions     ??? Medication list reviewed in Epic. The patient was instructed to inform the care team before taking any new medications or supplements. No drug interactions identified.   ??? Inactivated vaccines  o All age-appropriate vaccines should be completed at least 2 weeks prior to initiation. If vaccinated during therapy a booster should be given at least 3 months after discontinuation.  ??? Live vaccines  o Avoid use of live vaccines  o Live attenuated vaccines should not be given for at least 3 months after discontinuation    Storage, Handling Precautions, & Disposal     ??? Copaxone should be stored in the refrigerator.  o If necessary, may be stored at room temperature for up to 1 month.  ??? Used syringes should be discarded in a sharps container.      Current Medications (including OTC/herbals), Comorbidities and Allergies     Current Outpatient Medications   Medication Sig Dispense Refill   ??? amantadine HCL (SYMMETREL) 100 mg capsule Take 1 capsule (100 mg total) by mouth every morning. 90 capsule 1   ??? cholecalciferol, vitamin D3, 4,000 unit cap Take by mouth.     ??? etonogestrel (NEXPLANON) 68 mg Impl 68 mg by Subdermal route once.     ??? gabapentin (NEURONTIN) 300 MG capsule Take 1 capsule (300 mg total) by mouth nightly. 90 capsule 3   ??? glatiramer 40 mg/mL Syrg Inject 1 mL (40 mg total) under the skin 3 (three) times a week. 12 mL 0   ??? glipiZIDE (GLUCOTROL) 10 MG tablet Take 1 tablet (10 mg total) by mouth daily. 30 tablet 11   ??? metFORMIN (GLUCOPHAGE) 500 MG tablet [facility] veces al dia para una semana, despues tome dos [address], [place] veces al dia 120 tablet 11     No current facility-administered medications for this visit.       No Known Allergies    Patient Active Problem List   Diagnosis   ??? Bilateral optic neuritis   ??? Abnormal brain MRI   ??? Vitamin D deficiency   ??? Elevated LFTs   ??? History of gestational diabetes mellitus (GDM)   ??? Illiteracy   ??? Overweight   ??? Fatty liver   ??? Neuropathic pain   ??? Demyelinating disease of central nervous system, unspecified (CMS-HCC)   ??? Chronic fatigue   ??? Arthralgia   ??? Type 2 diabetes mellitus without complication, without long-term current use of insulin (CMS-HCC)   ??? Health care maintenance       Reviewed and up to date in Epic.    Appropriateness of Therapy     Is medication and dose appropriate based on diagnosis? Yes  Prescription has been clinically reviewed: Yes    Baseline Quality of Life Assessment      How many days over the past month did your multiple sclerosis  keep you from your normal activities? For example, brushing your teeth or getting up in the morning. 0    Financial Information     Medication Assistance provided: Manufacturer Assistance    Anticipated copay of $0 reviewed with patient. Verified delivery address.    Delivery Information     Scheduled delivery date: 05/01/20    Expected start date: 05/25/20    Medication will be delivered via Same Day Courier to the prescription address in [facility].  This shipment will not require a signature.      Explained the services we provide at [facility] and that each month we would call to set up refills.  Stressed importance of returning phone calls so that we could ensure they receive their medications in time each month.  Informed patient that we should be setting up refills 7-10 days prior to when they will run out of medication.  A pharmacist will reach out to perform a clinical assessment periodically.  Informed patient that a welcome packet and a drug information handout will be sent.      Patient verbalized understanding of the above information as well as how to contact the pharmacy at [phone] option 4 with any questions/concerns.  The pharmacy is open Monday through Friday 8:30am-4:30pm.  A pharmacist is available 24/7 via pager to answer any clinical questions they may have.    Patient Specific Needs     - Does the patient have any physical, cognitive, or cultural barriers? No    - Patient prefers to have medications discussed with  Patient     - Is the patient or caregiver able to read and understand education materials at a high school level or above? Yes    - Patient's primary language is  Spanish     - Is the patient high risk? No    - Does the patient require a Care Management Plan? No     - Does the patient require physician intervention or other additional services (i.e. nutrition, smoking cessation, social work)? No      Arnold Long  [facility] Specialty Pharmacist

## 2020-05-01 MED FILL — GLATIRAMER 40 MG/ML SUBCUTANEOUS SYRINGE: 28 days supply | Qty: 12 | Fill #0 | Status: AC

## 2020-05-30 DIAGNOSIS — R5382 Chronic fatigue, unspecified: Principal | ICD-10-CM

## 2020-05-30 DIAGNOSIS — G35 Multiple sclerosis: Principal | ICD-10-CM

## 2020-05-30 MED ORDER — AMANTADINE HCL 100 MG CAPSULE
ORAL_CAPSULE | Freq: Every morning | ORAL | 1 refills | 90 days | Status: CP
Start: 2020-05-30 — End: ?

## 2020-06-27 DIAGNOSIS — H469 Unspecified optic neuritis: Principal | ICD-10-CM

## 2020-06-27 NOTE — Unmapped (Signed)
[facility] Clinical Assessment & Refill Coordination Note    Tiffany Meadows, [name]: December 11, 1985  Phone: [phone] (home)     All above HIPAA information was verified with patient.     Was a Nurse, learning disability used for this call? No    Specialty Medication(s):   Neurology: glatiramer     Current Outpatient Medications   Medication Sig Dispense Refill   ??? amantadine HCL (SYMMETREL) 100 mg capsule Take 1 capsule (100 mg total) by mouth every morning. 90 capsule 1   ??? cholecalciferol, vitamin D3, 4,000 unit cap Take by mouth.     ??? etonogestrel (NEXPLANON) 68 mg Impl 68 mg by Subdermal route once.     ??? gabapentin (NEURONTIN) 300 MG capsule Take 1 capsule (300 mg total) by mouth nightly. 90 capsule 3   ??? glatiramer 40 mg/mL Syrg Inject 1 mL (40 mg total) under the skin 3 (three) times a week. 12 mL 0   ??? glipiZIDE (GLUCOTROL) 10 MG tablet Take 1 tablet (10 mg total) by mouth daily. 30 tablet 11   ??? metFORMIN (GLUCOPHAGE) 500 MG tablet [facility] veces al dia para una semana, despues tome dos [address], [place] veces al dia 120 tablet 11     No current facility-administered medications for this visit.        Changes to medications: Tiffany Meadows reports no changes at this time.    No Known Allergies    Changes to allergies: No    SPECIALTY MEDICATION ADHERENCE     glatiramer 40 mg/ml: 7 days of medicine on hand       Medication Adherence    Patient reported X missed doses in the last month: 0  Specialty Medication: glatiramer   Patient is on additional specialty medications: No  Informant: patient          Specialty medication(s) dose(s) confirmed: Regimen is correct and unchanged.     Are there any concerns with adherence? No    Adherence counseling provided? Not needed    CLINICAL MANAGEMENT AND INTERVENTION      Clinical Benefit Assessment:    Do you feel the medicine is effective or helping your condition? Yes    Clinical Benefit counseling provided? Not needed    Adverse Effects Assessment:    Are you experiencing any side effects? No    Are you experiencing difficulty administering your medicine? No    Quality of Life Assessment:    How many days over the past month did your MS  keep you from your normal activities? For example, brushing your teeth or getting up in the morning. 0    Have you discussed this with your provider? Not needed    Therapy Appropriateness:    Is therapy appropriate? Yes, therapy is appropriate and should be continued    DISEASE/MEDICATION-SPECIFIC INFORMATION      For patients on injectable medications: Patient currently has 3 doses left.  Next injection is scheduled for 06/28/20.    PATIENT SPECIFIC NEEDS     - Does the patient have any physical, cognitive, or cultural barriers? No    - Is the patient high risk? No    - Does the patient require a Care Management Plan? No     - Does the patient require physician intervention or other additional services (i.e. nutrition, smoking cessation, social work)? No      SHIPPING     Specialty Medication(s) to be Shipped:   Neurology: glatiramer  Other medication(s) to be shipped: No additional medications requested for fill at this time     Changes to insurance: No    Delivery Scheduled: Yes, Expected medication delivery date: 06/29/20.  However, Rx request for refills was sent to the provider as there are none remaining.     Medication will be delivered via Same Day Courier to the confirmed prescription address in [facility].    The patient will receive a drug information handout for each medication shipped and additional FDA Medication Guides as required.  Verified that patient has previously received a Conservation officer, historic buildings.    All of the patient's questions and concerns have been addressed.    Arnold Long   [facility] Specialty Pharmacist

## 2020-06-28 MED ORDER — GLATIRAMER 40 MG/ML SUBCUTANEOUS SYRINGE
SUBCUTANEOUS | 0 refills | 28.00000 days | Status: CP
Start: 2020-06-28 — End: 2020-07-25
  Filled 2020-06-29: qty 12, 28d supply, fill #0

## 2020-06-28 NOTE — Unmapped (Signed)
Last Visit Date: 08/13/2019  Next Visit Date: 07/11/2020    Lab Results   Component Value Date    Hep B Surface Ag Nonreactive 08/06/2017    Hep B S Ab Nonreactive (A) 11/21/2017    Hep B Core Total Ab Nonreactive 08/06/2017    Hepatitis C Ab Nonreactive 08/06/2017        No results found for this or any previous visit.      No results found for this or any previous visit.      No results found for this or any previous visit.

## 2020-06-29 MED FILL — GLATIRAMER 40 MG/ML SUBCUTANEOUS SYRINGE: 28 days supply | Qty: 12 | Fill #0 | Status: AC

## 2020-07-24 NOTE — Unmapped (Signed)
The [facility] has made a second and final attempt to reach this patient to refill the following medication:Glatiramer.      We have left voicemails on the following phone numbers: [phone] and have sent a MyChart message.    Dates contacted: 1/21, 1/24  Last scheduled delivery: 12/30    The patient may be at risk of non-compliance with this medication. The patient should call the [facility] at [phone] (option 4) to refill medication.    Tiffany Meadows   [facility] Specialty Technician

## 2020-07-25 DIAGNOSIS — H469 Unspecified optic neuritis: Principal | ICD-10-CM

## 2020-07-25 NOTE — Unmapped (Signed)
[facility] Refill Coordination Note    Specialty Medication(s) to be Shipped:   Neurology: glatiramer    Other medication(s) to be shipped: No additional medications requested for fill at this time     Tiffany Meadows, DOB: Nov 18, 1985  Phone: [phone] (home)       All above HIPAA information was verified with patient's son     Was a translator used for this call? No    Completed refill call assessment today to schedule patient's medication shipment from the [facility] [phone]).       Specialty medication(s) and dose(s) confirmed: Regimen is correct and unchanged.   Changes to medications: Tiffany Meadows reports no changes at this time.  Changes to insurance: No  Questions for the pharmacist: No    Confirmed patient received Welcome Packet with first shipment. The patient will receive a drug information handout for each medication shipped and additional FDA Medication Guides as required.       DISEASE/MEDICATION-SPECIFIC INFORMATION        N/A    SPECIALTY MEDICATION ADHERENCE     Medication Adherence    Patient reported X missed doses in the last month: 0  Specialty Medication: Glatiramer 40mg /ml  Patient is on additional specialty medications: No  Informant: child/children                Glaturamer  40 mg/ml: 10 days of medicine on hand       SHIPPING     Shipping address confirmed in Epic.     Delivery Scheduled: Yes, Expected medication delivery date: 08/01/20.  However, Rx request for refills was sent to the provider as there are none remaining.     Medication will be delivered via Same Day Courier to the prescription address in Epic WAM.    Tiffany Meadows   [facility] Specialty Technician

## 2020-07-26 MED ORDER — GLATIRAMER 40 MG/ML SUBCUTANEOUS SYRINGE
SUBCUTANEOUS | 0 refills | 28.00000 days
Start: 2020-07-26 — End: ?

## 2020-07-28 MED ORDER — GLATIRAMER 40 MG/ML SUBCUTANEOUS SYRINGE
SUBCUTANEOUS | 0 refills | 28 days | Status: CP
Start: 2020-07-28 — End: ?
  Filled 2020-08-01: qty 12, 28d supply, fill #0

## 2020-08-24 DIAGNOSIS — H469 Unspecified optic neuritis: Principal | ICD-10-CM

## 2020-08-24 NOTE — Unmapped (Signed)
Last Visit Date: 08/13/2019  Next Visit Date:10/03/2020    Lab Results   Component Value Date    Hep B Surface Ag Nonreactive 08/06/2017    Hep B S Ab Nonreactive (A) 11/21/2017    Hep B Core Total Ab Nonreactive 08/06/2017    Hepatitis C Ab Nonreactive 08/06/2017        No results found for this or any previous visit.      No results found for this or any previous visit.      No results found for this or any previous visit.

## 2020-08-24 NOTE — Unmapped (Signed)
[facility] Refill Coordination Note    Specialty Medication(s) to be Shipped:   Neurology: glatiramer    Other medication(s) to be shipped: No additional medications requested for fill at this time     Tiffany Meadows, DOB: [birth year]  Phone: [phone] (home)       All above HIPAA information was verified with patient.     Was a Nurse, learning disability used for this call? No    Completed refill call assessment today to schedule patient's medication shipment from the [facility] [phone]).       Specialty medication(s) and dose(s) confirmed: Regimen is correct and unchanged.   Changes to medications: Tiffany Meadows reports no changes at this time.  Changes to insurance: No  Questions for the pharmacist: No    Confirmed patient received Welcome Packet with first shipment. The patient will receive a drug information handout for each medication shipped and additional FDA Medication Guides as required.       DISEASE/MEDICATION-SPECIFIC INFORMATION        For patients on injectable medications: Patient currently has 4 doses left.  Next injection is scheduled for 08/25/2020.    SPECIALTY MEDICATION ADHERENCE     Medication Adherence    Patient reported X missed doses in the last month: 0  Specialty Medication: Glatiramer  Patient is on additional specialty medications: No  Patient is on more than two specialty medications: No  Any gaps in refill history greater than 2 weeks in the last 3 months: no  Demonstrates understanding of importance of adherence: yes  Informant: patient  Reliability of informant: reliable  Confirmed plan for next specialty medication refill: delivery by pharmacy  Refills needed for supportive medications: not needed                Glaturamer  40 mg/ml: 10 days of medicine on hand       SHIPPING     Shipping address confirmed in Epic.     Delivery Scheduled: Yes, Expected medication delivery date: 08/31/2020.  However, Rx request for refills was sent to the provider as there are none remaining.     Medication will be delivered via Same Day Courier to the prescription address in Epic WAM.    Tiffany Meadows   [facility] Specialty Technician

## 2020-08-25 MED ORDER — GLATIRAMER 40 MG/ML SUBCUTANEOUS SYRINGE
SUBCUTANEOUS | 0 refills | 28.00000 days | Status: CP
Start: 2020-08-25 — End: ?
  Filled 2020-08-31: qty 12, 28d supply, fill #0

## 2020-08-26 NOTE — Unmapped (Signed)
Patient is past due for clinic visit.  My chart message and letter sent to patient.  Staff asked to call and schedule.  Refill Glatiramer month by month.

## 2020-09-21 DIAGNOSIS — H469 Unspecified optic neuritis: Principal | ICD-10-CM

## 2020-09-21 NOTE — Unmapped (Signed)
[facility] Refill Coordination Note    Specialty Medication(s) to be Shipped:   Inflammatory Disorders: GLATIRAMER 40 MG/ML SYR    Other medication(s) to be shipped: No additional medications requested for fill at this time     Tiffany Meadows, DOB: 06/02/1986  Phone: [phone] (home)       All above HIPAA information was verified with patient.     Was a Nurse, learning disability used for this call? No    Completed refill call assessment today to schedule patient's medication shipment from the [facility] [phone]).       Specialty medication(s) and dose(s) confirmed: Regimen is correct and unchanged.   Changes to medications: Tiffany Meadows reports no changes at this time.  Changes to insurance: No  Questions for the pharmacist: No    Confirmed patient received a Conservation officer, historic buildings and a Surveyor, mining with first shipment. The patient will receive a drug information handout for each medication shipped and additional FDA Medication Guides as required.       DISEASE/MEDICATION-SPECIFIC INFORMATION        For patients on injectable medications: Patient currently has 12 doses left.  Next injection is scheduled for 3/25.    SPECIALTY MEDICATION ADHERENCE     Medication Adherence    Patient reported X missed doses in the last month: 0  Specialty Medication: Glatiramer  Patient is on additional specialty medications: No  Patient is on more than two specialty medications: No  Informant: patient  Confirmed plan for next specialty medication refill: delivery by pharmacy  Refills needed for supportive medications: not needed          Refill Coordination    Has the Patients' Contact Information Changed: No  Is the Shipping Address Different: No           GLATIRAMER 40 mg/ml: 28 days of medicine on hand         SHIPPING     Shipping address confirmed in Epic.     Delivery Scheduled: Yes, Expected medication delivery date: 4/1.  However, Rx request for refills was sent to the provider as there are none remaining.     Medication will be delivered via UPS to the prescription address in Epic WAM.    Jolene Schimke   [facility] Specialty Technician

## 2020-09-21 NOTE — Unmapped (Signed)
REFILL REQUEST    Last Visit Date: 08/13/2019  Next Visit Date: 10/04/2019      Last seen [facility]  Lab Results   Component Value Date    Hep B Surface Ag Nonreactive 08/06/2017    Hep B S Ab Nonreactive (A) 11/21/2017    Hep B Core Total Ab Nonreactive 08/06/2017    Hepatitis C Ab Nonreactive 08/06/2017        No results found for this or any previous visit.      No results found for this or any previous visit.      No results found for this or any previous visit.

## 2020-09-22 MED ORDER — GLATIRAMER 40 MG/ML SUBCUTANEOUS SYRINGE
SUBCUTANEOUS | 0 refills | 28.00000 days | Status: CP
Start: 2020-09-22 — End: ?
  Filled 2020-09-28: qty 12, 28d supply, fill #0

## 2020-09-22 NOTE — Unmapped (Signed)
Refill glatiramer for one more month. Last seen 08/2019.  No more refills will be provided if she is not seen for an appointment.  Schedulers asked to call and schedule.  A third my chart message and letter has been sent.

## 2020-10-04 ENCOUNTER — Ambulatory Visit: Admit: 2020-10-04 | Discharge: 2020-10-05 | Attending: Physician Assistant | Primary: Physician Assistant

## 2020-10-04 DIAGNOSIS — G379 Demyelinating disease of central nervous system, unspecified: Principal | ICD-10-CM

## 2020-10-04 NOTE — Unmapped (Signed)
[facility]  Multiple Sclerosis / Neuroimmunology Division  Adilenne Ashworth Julieanne Cotton [facility]  Physician Assistant    Phone: [phone]  Fax: [phone]      Patient Name: Tiffany Meadows   Date of Birth: Nov 10, 1985  Medical Record Number: [id number]  [address] [place]  [place] [address]     Direct entry by:  Cy Blamer, PA-C.  Supervising Physician: Dr. Desma Mcgregor, Dr. Quillian Quince, [facility] Pamala Duffel.    DATE OF VISIT: October 04, 2020    REASON FOR VISIT: Followup in the Neuroimmunology Clinic for evaluation of Multiple Sclerosis / Demyelinating Disease / or other Neurological problem. History of COVID19 infection 12/2018.    **I personally spent 45 minutes face-to-face and non-face-to-face in the care of this patient, which includes all pre, intra, and post visit time on the date of service.      Last seen by Dr. Johnnye Lana 08/18/2018.    SPANISH INTERPRETOR SERVICE WAS USED via phone:    ASSESSMENT AND PLAN:  ** Possible Multiple Sclerosis.  -Copaxone started 12/16/2017. Contine. Receives it from the [employer] PAP.  -Schedule yearly MRI of the  Brain, Cervical  and Thoracic spine with / without contrast.   **Recommend COVID19 boosters.  **Refer to PCP or urgent care for right hip pain that is reproducible on palpation. May take over the counter medication.  **Follow up in 6 months.    INTERVAL HISTORY / CHIEF COMPLAINT :  Patient reports no new neurological symptoms and no MS relapses since the last visit 08/13/2019.  Reports right hip pain for 4 days. No report of a fall.    Recieved COVID19 vaccine, Pfizer, #1 &2 12/2019. Has not received any boosters.    Receives Glatiramer 40mg /mL from [facility] Patient Assistance program for patient pick up in clinic.    PHYSICAL EXAMINATION:  GENERAL:  Alert and oriented to person, place, time and situation.    Recent and remote memory intact.      Neurological Examination: Cranial Nerves:   II, III- Pupils are equal 2 mm and reactive to light b/l.  Visual Acuity:   OD 20/20, OS 20/20.  III, IV, VI- extra ocular movements are intact, No ptosis, no nystagmus.  V- sensation of the face intact b/l.  VII- face symmetrical, no facial droop, normal facial movements with smile/grimace  VIII- Hearing grossly intact.  IX and X- symmetric palate contraction, normal gag bilaterally  XI- Full shoulder shrug bilaterally  XII- Tongue protrudes midline, full range of movements of the tongue    Motor Exam:      Muscles UEs   LEs     R L   R L   Deltoids 5/5 5/5 Hip flexors  ** 5/5   Biceps 5/5 5/5 Hip extensors ** 5/5   Triceps 5/5 5/5 Knee flexors 5/5 5/5   Hand grip 5/5 5/5 Knee extensors 5/5 5/5      Foot dorsal flexors 5/5 5/5      Foot plantar flexors 5/5 5/5      Strength limited due to hip pain,  Normal bulk and tone.  No clonus.    Reflexes R L   Brachioradialis +2 +2   Biceps +2 +2   Triceps +2 +2   Patella +2 +2   Achilles +2 +2     Negative babinski.    Sensory UEs LEs    R L R L  Light touch WNL WNL WNL WNL   Pin prick WNL WNL WNL WNL   Vibration WNL WNL WNL WNL   Proprioception   WNL WNL        Cerebellar/Coordination:  Rapid alternating movements, finger-to-nose and heel-to-shin  bilaterally demonstrates no abnormalities.    Romberg was negative with eyes closed.  Gait: Slight limp due to right hip pain. Pain is reproducible on palpation of posterior, lateral  right hip. No deformities felt. FROM of right hip but painful.  Able to tandem, heel, and toe gait.     PRIOR HISTORY: A 35 y.o. female??with no PMH who presented??in the first week of 03/2017 with vision loss bilaterally for one week with eye pain??and abnormal MRI that showed bilateral optic neuritis. The patient was placed on IV Solu-Medrol. MRI of the brain was concerning for possible demyelinating disease. The patient was seen in consultation by [facility] neurology who recommended continuing IV solumedrol.   Her vision improved significantly with IV 3 doses of Solu-Medrol. She denies prior neurological episodes and her vision recovered completely. Continues to have  residual left leg hypesthesia.   She denies miscarriages, hiccups and vomiting episodes for no clear reason.     Her diagnosis is possible MS: She is NMO-IgG and MOG-IgG negative.  Her CSF studies show mild pleocytosis with no IgG OCBs. An autoimmune panel to screen for other conditions that may give bilateral optic neuritis was unremarkable.  She has no spinal cord lesions and one small subcortical and non -specific brain MRI lesion.     The patient states she dos not qualify for [facility] and for financial reasons therefore only performed brain, C/T spine MRI at the next MRI follow-up.   ??  Diagnoses with Sacroiliitis.   Xrays showed Mild osteoarthrosis of the pubic symphysis and Mild facet joint narrowing and sclerosis is present at L4-5 and L5-S1 bilaterally.    DMD History:  Glatiramer acetate:  12/26/2017-ongoing  ????  MRI: REPORTS:  09/04/2018 MRI of the brain with / without contrast. report compared to 04/03/2018: increased T2/FLAIR signal. Several of these lesions were seen on the preceding exam, but some more not. The previous exam demonstrated significant motion. Therefore, I am uncertain if there are new lesions or just poorly visualized lesions previously. Recommend follow-up for further evaluation, and consider sedation for future exams. ?? No enhancing lesions are seen. Increased T2 signal within the retrobulbar and in the canalicular segments of the bilateral optic nerves. No demonstrable enhancement on postcontrast images.     04/03/2017: Brain MRI with and w/o contrast, report: 1. Bilateral optic neuritis.  2. Single FLAIR hyperintense focus superficial left frontal white matter. While appearance is nonspecific, presence of a white matter lesion in the setting of optic neuritis raises concern for demyelinating disease.    07/10/2017: C/T  spine MRI with and w/o contrast, report: --No convincing evidence of demyelinating lesions within the cervical or thoracic spinal cord. --Mild degenerative disc disease at C5-6 and C6-7 without high-grade canal or neural foraminal stenosis.--Bone marrow signal is diffusely mildly T1-hypointense, possibly reflective of anemia.  ??  Lumbar puncture:  ??? IgG, CSF 04/05/2017 2.4  0.5 - 7.0 mg/dL Final   ??? Albumin, CSF 04/05/2017 17.1  5.0 - 35.7 mg/dL Final   ??? Albumin, Serum 04/04/2017 4400  3,500-5,000 mg/dL Final   ??? CSF IGG Index 04/04/2017 0.5  0.3 - 0.8 Final   ??? CSF Oligoclonal Bands 04/04/2017  negative negative Final   ??? Ratio Interpretation 04/04/2017  normal ?? Final   ??? Albumin 04/04/2017 4.4  3.5 - 5.0 g/dL Final   ??? Total IgG 16/04/9603 1201  600-1,700 mg/dL Final   ??? Tube # CSF 04/05/2017 Tube 1  ?? Final   ??? Color, CSF 04/05/2017 Colorless  ?? Final   ??? Appearance, CSF 04/05/2017 Clear  ?? Final   ??? Nucleated Cells, CSF 04/05/2017 8* 0 - 5 ul Final   ??? RBC, CSF 04/05/2017 58* 0 ul Final   ??? Neutrophil %, CSF 04/05/2017 1.0  0.0 - 6.0 % Final   ??? Lymphs %, CSF 04/05/2017 85.0* 40.0 - 80.0 % Final   ??? Mono/Macrophage %, CSF 04/05/2017 14.0* 15.0 - 45.0 % Final   ??? Tube # CSF 04/05/2017 Tube 4  ?? Final   ??? Color, CSF 04/05/2017 Colorless  ?? Final   ??? Appearance, CSF 04/05/2017 Clear  ?? Final   ??? Nucleated Cells, CSF 04/05/2017 13* 0 - 5 ul Final   ??? RBC, CSF 04/05/2017 2* 0 ul Final   ??? Lymphs %, CSF 04/05/2017 98.0* 40.0 - 80.0 % Final   ??? Mono/Macrophage %, CSF 04/05/2017 2.0* 15.0 - 45.0 % Final   ??? Protein, CSF 04/05/2017 29  15 - 45 mg/dL Final   ??? Glucose, CSF 04/05/2017 86* 40 - 70 mg/dL Final   ??? CSF cytology 04/05/2017 -Mild inflammatory pleocytosis, negative for malignant cells. ?? Final   ??    REVIEW OF SYSTEMS:  A 10-systems review was performed and, unless otherwise noted, declared negative by patient.    Current Outpatient Medications   Medication Sig Dispense Refill   ??? amantadine HCL (SYMMETREL) 100 mg capsule Take 1 capsule (100 mg total) by mouth every morning. 90 capsule 1   ??? cholecalciferol, vitamin D3, 4,000 unit cap Take by mouth.     ??? etonogestrel (NEXPLANON) 68 mg Impl 68 mg by Subdermal route once.     ??? gabapentin (NEURONTIN) 300 MG capsule Take 1 capsule (300 mg total) by mouth nightly. 90 capsule 3   ??? glatiramer 40 mg/mL Syrg Inject the contents of 1 syringe (40 mg total) under the skin 3 (three) times a week. 12 mL 0   ??? glipiZIDE (GLUCOTROL) 10 MG tablet Take 1 tablet (10 mg total) by mouth daily. 30 tablet 11   ??? metFORMIN (GLUCOPHAGE) 500 MG tablet [facility] veces al dia para una semana, despues tome dos [address], [place] veces al dia 120 tablet 11     No current facility-administered medications for this visit.       Patient Active Problem List   Diagnosis   ??? Bilateral optic neuritis   ??? Abnormal brain MRI   ??? Vitamin D deficiency   ??? Elevated LFTs   ??? History of gestational diabetes mellitus (GDM)   ??? Illiteracy   ??? Overweight   ??? Fatty liver   ??? Neuropathic pain   ??? Demyelinating disease of central nervous system, unspecified (CMS-HCC)   ??? Chronic fatigue   ??? Arthralgia   ??? Type 2 diabetes mellitus without complication, without long-term current use of insulin (CMS-HCC)   ??? Health care maintenance       No Known Allergies    Social History     Socioeconomic History   ??? Marital status: Married     Spouse name: Not on file   ??? Number of children: Not on file   ??? Years of education: Not on file   ??? Highest education level:  Not on file   Occupational History   ??? Not on file   Tobacco Use   ??? Smoking status: Never Smoker   ??? Smokeless tobacco: Never Used   Substance and Sexual Activity   ??? Alcohol use: No   ??? Drug use: No   ??? Sexual activity: Yes     Partners: Male   Other Topics Concern   ??? Not on file   Social History Narrative   ??? Not on file     Social Determinants of Health     Financial Resource Strain: Not on file   Food Insecurity: Not on file   Transportation Needs: Not on file   Physical Activity: Not on file   Stress: Not on file   Social Connections: Not on file

## 2020-10-04 NOTE — Unmapped (Addendum)
It was a pleasure seeing you today. Below is your visit summary:    **You can take over the counter medications for hip pian such as Tylenol or Ibuprofen. If this does not help then go see your primary provider or urgent care.    **Receive the COVID19 boosters.  **Schedule your MRI's .  **Follow up 6 months.    Stephanie Littman Jannett Celestine PA-C, MPAS  Division of Multiple Sclerosis    **[facility] at [facility]  [address], [address]  [place], [place] [address]    Phone [phone]  Fax [phone]    [facility] is a specialty clinic and there is a need for you to have a  primary care provider  who will take care of your non-neurological health.   Please set this up if you have not already done so.      ?? In case of:  ?? a suspected relapse (new symptoms or worsening existing symptoms, lasting for >24h)  OR  ?? a need for an additional appointment for other reasons  OR   ?? if you have other questions: please contact:       [facility] Desk   Phone: [phone]      ?? If you have questions for our  pharmacist, please call:      Worthy Flank, PharmD, CPP  Phone: [phone]      ?? If you need financial assistance, please call:      Financial Assistance Unit      Phone: [phone]

## 2020-10-19 NOTE — Unmapped (Signed)
Patient has enough medication on hand, rescheduling refill call for 5/12

## 2020-11-03 DIAGNOSIS — M792 Neuralgia and neuritis, unspecified: Principal | ICD-10-CM

## 2020-11-03 MED ORDER — GABAPENTIN 300 MG CAPSULE
ORAL_CAPSULE | 0 refills | 0 days | Status: CP
Start: 2020-11-03 — End: ?

## 2020-11-05 ENCOUNTER — Ambulatory Visit: Admit: 2020-11-05 | Discharge: 2020-11-06

## 2020-11-05 MED ADMIN — gadoterate meglumine (DOTAREM) Soln 14 mL: 14 mL | INTRAVENOUS | @ 14:00:00 | Stop: 2020-11-05

## 2020-11-09 DIAGNOSIS — H469 Unspecified optic neuritis: Principal | ICD-10-CM

## 2020-11-09 NOTE — Unmapped (Signed)
Neurology Clinical Pharmacist Telephone Call    Medication: Glatiramer    Spoke with patient using spanish interpretor following potential non-adherence noted by [facility] staff. Patient confirmed that she has ~1 month supply on hand currently despite last 90-month supply shipped on 09/28/20. Patient confirmed dosing schedule. Patient denies missed doses and has family member help keep her on track with dosing schedule (MWF.) Upon further conversation, it appears when patient was switched from retail pharmacy to [facility]/mfr assistance she received medication from both pharmacies at same time period, leading to additional medication on hand. Patient checked medication on hand now and stated that expiration date was in 2023. I counseled her that current stock could be used and she can delay future shipment from [facility]. Patient verbalized understanding. No further concerns for non-adherence.     Sandria Bales, PharmD, MPA  [facility] PGY2 Ambulatory Care Pharmacy Resident     Worthy Flank, PharmD, CPP  Clinical Pharmacist, [facility]  Phone: [phone]

## 2020-11-09 NOTE — Unmapped (Signed)
Last Visit Date:10/04/2020  Next Visit Date: 04/06/2021      Last [facility]  Lab Results   Component Value Date    Hep B Surface Ag Nonreactive 08/06/2017    Hep B S Ab Nonreactive (A) 11/21/2017    Hep B Core Total Ab Nonreactive 08/06/2017    Hepatitis C Ab Nonreactive 08/06/2017        No results found for this or any previous visit.      No results found for this or any previous visit.      No results found for this or any previous visit.

## 2020-11-09 NOTE — Unmapped (Signed)
I reviewed this patient case and all documentation provided by the learner and was readily available for consultation during their interaction with the patient.  I agree with the assessment and plan listed below.    Jayleen Scaglione H Amaryah Mallen   [facility] Specialty Pharmacist

## 2020-11-09 NOTE — Unmapped (Signed)
Pt stated she has over a month supply of glatiramer remaining. Rescheduled refill call for 5/27    [facility] Clinical Assessment & Refill Coordination Note    Tiffany Meadows, [place]: 28-Aug-1985  Phone: [phone] (home)     All above HIPAA information was verified with patient.     Was a Nurse, learning disability used for this call? Yes, spanish. Patient language is appropriate in [facility]    Specialty Medication(s):   Neurology: glatiramer     Current Outpatient Medications   Medication Sig Dispense Refill   ??? amantadine HCL (SYMMETREL) 100 mg capsule Take 1 capsule (100 mg total) by mouth every morning. 90 capsule 1   ??? cholecalciferol, vitamin D3, 4,000 unit cap Take by mouth.     ??? etonogestrel (NEXPLANON) 68 mg Impl 68 mg by Subdermal route once.     ??? gabapentin (NEURONTIN) 300 MG capsule TAKE 1 CAPSULE(300 MG) BY MOUTH EVERY NIGHT 30 capsule 0   ??? glatiramer 40 mg/mL Syrg Inject the contents of 1 syringe (40 mg total) under the skin 3 (three) times a week. 12 mL 0   ??? glipiZIDE (GLUCOTROL) 10 MG tablet Take 1 tablet (10 mg total) by mouth daily. 30 tablet 11   ??? metFORMIN (GLUCOPHAGE) 500 MG tablet [facility] veces al dia para una semana, despues tome dos [address], [place] veces al dia 120 tablet 11     No current facility-administered medications for this visit.        Changes to medications: [name] reports no changes at this time.    No Known Allergies    Changes to allergies: No    SPECIALTY MEDICATION ADHERENCE     Glatiramer 40 mg: 28 days of medicine on hand       Medication Adherence    Patient reported X missed doses in the last month: 0  Specialty Medication: Glatiramer 40mg  three times weekly (MWF)  Patient is on additional specialty medications: No  Informant: patient          Specialty medication(s) dose(s) confirmed: Regimen is correct and unchanged.     Are there any concerns with adherence? No    Adherence counseling provided? Not needed    CLINICAL MANAGEMENT AND INTERVENTION      Clinical Benefit Assessment:    Do you feel the medicine is effective or helping your condition? Yes    Clinical Benefit counseling provided? Not needed    Adverse Effects Assessment:    Are you experiencing any side effects? No    Are you experiencing difficulty administering your medicine? No    Quality of Life Assessment:    How many days over the past month did your MS  keep you from your normal activities? For example, brushing your teeth or getting up in the morning. Patient declined to answer    Have you discussed this with your provider? Not needed    Acute Infection Status:    Acute infections noted within Epic:  No active infections  Patient reported infection: None    Therapy Appropriateness:    Is therapy appropriate? Yes, therapy is appropriate and should be continued    DISEASE/MEDICATION-SPECIFIC INFORMATION      For patients on injectable medications: Patient currently has 12 doses left.  Next injection is scheduled for 11/10/20.    PATIENT SPECIFIC NEEDS     - Does the patient have any physical, cognitive, or cultural barriers? No    - Is the patient  high risk? No    - Does the patient require a Care Management Plan? No     - Does the patient require physician intervention or other additional services (i.e. nutrition, smoking cessation, social work)? No      SHIPPING     Specialty Medication(s) to be Shipped:   Neurology: glatiramer    Other medication(s) to be shipped: No additional medications requested for fill at this time     Changes to insurance: No    Delivery Scheduled: Patient declined refill at this time due to having >28-day supply on hand.     The patient or caregiver noted above participated in the development of this care plan and knows that they can request review of or adjustments to the care plan at any time.      All of the patient's questions and concerns have been addressed.    Vernon Prey   [facility] Specialty Pharmacist

## 2020-11-10 MED ORDER — GLATIRAMER 40 MG/ML SUBCUTANEOUS SYRINGE
SUBCUTANEOUS | 0 refills | 28.00000 days | Status: CP
Start: 2020-11-10 — End: ?
  Filled 2020-12-07: qty 12, 28d supply, fill #0

## 2020-12-01 NOTE — Unmapped (Signed)
[facility] Refill Coordination Note    Specialty Medication(s) to be Shipped:   Neurology: glatiramer    Other medication(s) to be shipped: No additional medications requested for fill at this time     Tiffany Meadows, DOB: 04-15-1986  Phone: [phone] (home)       All above HIPAA information was verified with patient.     Was a Nurse, learning disability used for this call? No    Completed refill call assessment today to schedule patient's medication shipment from the [facility] [phone]).  All relevant notes have been reviewed.     Specialty medication(s) and dose(s) confirmed: Regimen is correct and unchanged.   Changes to medications: Tiffany Meadows reports no changes at this time.  Changes to insurance: No  New side effects reported not previously addressed with a pharmacist or physician: None reported  Questions for the pharmacist: No    Confirmed patient received a Conservation officer, historic buildings and a Surveyor, mining with first shipment. The patient will receive a drug information handout for each medication shipped and additional FDA Medication Guides as required.       DISEASE/MEDICATION-SPECIFIC INFORMATION        For patients on injectable medications: Patient currently has 6 doses left.  Next injection is scheduled for next week.    SPECIALTY MEDICATION ADHERENCE     Medication Adherence    Patient reported X missed doses in the last month: 0  Specialty Medication: Glatiramer  Patient is on additional specialty medications: No  Any gaps in refill history greater than 2 weeks in the last 3 months: no  Demonstrates understanding of importance of adherence: yes  Informant: patient  Reliability of informant: reliable  Confirmed plan for next specialty medication refill: delivery by pharmacy  Refills needed for supportive medications: not needed              Were doses missed due to medication being on hold? No    Glatiramer 40 mg/ml: 14 days of medicine on hand       REFERRAL TO PHARMACIST     Referral to the pharmacist: Not needed      [facility]     Shipping address confirmed in Epic.     Delivery Scheduled: Yes, Expected medication delivery date: 12/07/2020.     Medication will be delivered via Same Day Courier to the prescription address in Epic WAM.    [name]   [facility] Specialty Technician

## 2020-12-06 NOTE — Unmapped (Signed)
error 

## 2021-01-02 DIAGNOSIS — M792 Neuralgia and neuritis, unspecified: Principal | ICD-10-CM

## 2021-01-02 DIAGNOSIS — G35 Multiple sclerosis: Principal | ICD-10-CM

## 2021-01-02 DIAGNOSIS — R5382 Chronic fatigue, unspecified: Principal | ICD-10-CM

## 2021-01-02 DIAGNOSIS — E119 Type 2 diabetes mellitus without complications: Principal | ICD-10-CM

## 2021-01-02 MED ORDER — METFORMIN 500 MG TABLET
ORAL_TABLET | 11 refills | 0 days
Start: 2021-01-02 — End: ?

## 2021-01-02 MED ORDER — AMANTADINE HCL 100 MG CAPSULE
ORAL_CAPSULE | 1 refills | 0.00000 days
Start: 2021-01-02 — End: ?

## 2021-01-02 MED ORDER — GABAPENTIN 300 MG CAPSULE
ORAL_CAPSULE | 0 refills | 0 days
Start: 2021-01-02 — End: ?

## 2021-01-02 NOTE — Unmapped (Signed)
Last Visit Date: 10/04/2020  Next Visit Date: 04/06/2021      Last seen [facility]  Lab Results   Component Value Date    Hep B Surface Ag Nonreactive 08/06/2017    Hep B S Ab Nonreactive (A) 11/21/2017    Hep B Core Total Ab Nonreactive 08/06/2017    Hepatitis C Ab Nonreactive 08/06/2017        No results found for this or any previous visit.      No results found for this or any previous visit.      No results found for this or any previous visit.

## 2021-01-03 ENCOUNTER — Ambulatory Visit: Admit: 2021-01-03 | Discharge: 2021-01-04 | Attending: Adult Health | Primary: Adult Health

## 2021-01-03 LAB — BASIC METABOLIC PANEL
ANION GAP: 5 mmol/L (ref 5–14)
BLOOD UREA NITROGEN: 9 mg/dL (ref 9–23)
BUN / CREAT RATIO: 17
CALCIUM: 9.3 mg/dL (ref 8.7–10.4)
CHLORIDE: 103 mmol/L (ref 98–107)
CO2: 27 mmol/L (ref 20.0–31.0)
CREATININE: 0.54 mg/dL — ABNORMAL LOW
EGFR CKD-EPI (2021) FEMALE: >90 mL/min/{1.73_m2} (ref >=60)
GLUCOSE RANDOM: 180 mg/dL — ABNORMAL HIGH (ref 70–179)
POTASSIUM: 3.6 mmol/L (ref 3.4–4.8)
SODIUM: 135 mmol/L (ref 135–145)

## 2021-01-03 LAB — HEMOGLOBIN A1C
ESTIMATED AVERAGE GLUCOSE: 229 mg/dL
HEMOGLOBIN A1C: 9.6 % — ABNORMAL HIGH (ref 4.8–5.6)

## 2021-01-03 MED ORDER — GABAPENTIN 300 MG CAPSULE
ORAL_CAPSULE | Freq: Every evening | ORAL | 2 refills | 30 days | Status: CP
Start: 2021-01-03 — End: ?

## 2021-01-03 MED ORDER — METFORMIN 500 MG TABLET
ORAL_TABLET | Freq: Two times a day (BID) | ORAL | 0 refills | 90 days | Status: CP
Start: 2021-01-03 — End: 2021-04-03

## 2021-01-03 MED ORDER — AMANTADINE HCL 100 MG CAPSULE
ORAL_CAPSULE | ORAL | 1 refills | 0.00000 days | Status: CP
Start: 2021-01-03 — End: ?

## 2021-01-03 NOTE — Unmapped (Signed)
[facility] Refill Coordination Note    Specialty Medication(s) to be Shipped:   Neurology: glatiramer    Other medication(s) to be shipped: No additional medications requested for fill at this time     Tiffany Meadows, DOB: 04-28-1986  Phone: [phone] (home)       All above HIPAA information was verified with patient's family member, son.     Was a Nurse, learning disability used for this call? No    Completed refill call assessment today to schedule patient's medication shipment from the [facility] [phone]).  All relevant notes have been reviewed.     Specialty medication(s) and dose(s) confirmed: Regimen is correct and unchanged.   Changes to medications: Alianis reports no changes at this time.  Changes to insurance: No  New side effects reported not previously addressed with a pharmacist or physician: None reported  Questions for the pharmacist: No    Confirmed patient received a Conservation officer, historic buildings and a Surveyor, mining with first shipment. The patient will receive a drug information handout for each medication shipped and additional FDA Medication Guides as required.       DISEASE/MEDICATION-SPECIFIC INFORMATION        For patients on injectable medications: Patient currently has 4 doses left.  Next injection is scheduled for 7/8.    SPECIALTY MEDICATION ADHERENCE     Medication Adherence    Patient reported X missed doses in the last month: 0  Specialty Medication: Glatiramer  Patient is on additional specialty medications: No  Patient is on more than two specialty medications: No  Any gaps in refill history greater than 2 weeks in the last 3 months: no  Demonstrates understanding of importance of adherence: yes  Informant: child/children              Were doses missed due to medication being on hold? No    Glatiramer 40mg /ml: Patient has 9 days of medication on hand    REFERRAL TO PHARMACIST     Referral to the pharmacist: Not needed      [facility]     Shipping address confirmed in Epic.     Delivery Scheduled: Yes, Expected medication delivery date: 7/11.     Medication will be delivered via Same Day Courier to the prescription address in Epic WAM.    Olga Millers   [facility] Specialty Technician

## 2021-01-04 NOTE — Unmapped (Signed)
[facility] NOTE    ASSESSMENT/PLAN:    Problem List Items Addressed This Visit        Endocrine    Type 2 diabetes mellitus without complication, without long-term current use of insulin (CMS-HCC)    Relevant Medications    metFORMIN (GLUCOPHAGE) 500 MG tablet    Other Relevant Orders    Basic Metabolic Panel    Hemoglobin A1c       Other    Neuropathic pain    Relevant Medications    gabapentin (NEURONTIN) 300 MG capsule   Stable, refills provided up to 90 days. Will recheck a1c/BMP today. Emphasized importance of following up with PCP before then for further refills. Educated regarding signs and symptoms to seek further medical attention.        Spanish interpreter utilized for entirety of visit.    Chief Complaint   Patient presents with   ??? medication reflls     Metformin and gabapentin          SUBJECTIVE:    Ms. Tiffany Meadows is a 35 y.o. female that presents to Urgent Care  today regarding the following issues:    #med refill   Metformin and gabapentin   No SE or missed doses   Rarely checks BG  Denies feelings of hyper/hypoglycemia   Takes gabapentin at night for pain (MS)   Works well for her     I have reviewed the patients problem list, medical history, surgical history, laboratory history and recent hospitalizations, current medications, allergies, and social history and updated them as needed.    Review of symptoms:  Negative unless  Otherwise stated in HPI.     Ms. Tiffany Meadows  reports that she has never smoked. She has never used smokeless tobacco.    OBJECTIVE:    VITALS:   Vitals:    01/03/21 1827   BP: 111/71   Pulse: 69   Temp: 36.2 ??C (97.1 ??F)    Wt:   Wt Readings from Last 3 Encounters:   01/03/21 72.6 kg (160 lb)   10/04/20 72.5 kg (159 lb 12.8 oz)   11/04/19 69 kg (152 lb 3.2 oz)       Gen: Pleasant and cooperative in NAD, resting comfortably, appears stated age  Head: Normocephalic, atraumatic  EENT: PERRLA, EOMI, sclera clear  CV: Normal S1 S2 no m/r/g  Resp: Clear to ascultation bilaterally without adventitious sounds  GI: Soft, nontender,+BS, no palpable masses  Msk: No obvious deformities or swelling, strength 5/5 in BUE and BLE  Neuro:  A&O x 4, speech appropriate, normal gait  Skin: Warm and dry, no obvious rash or suspicious lesions present  Psych:  Mood is good, able to carry on normal conversation, with good eye contact, without evidence of anxiety or depression    LABS:  No results found for any visits on 01/03/21.    STUDIES:  No results found.    ___________________________________  CURRENT MEDS:  Current Outpatient Medications   Medication Sig Dispense Refill   ??? amantadine HCL (SYMMETREL) 100 mg capsule TAKE 1 CAPSULE(100 MG) BY MOUTH EVERY MORNING 90 capsule 1   ??? cholecalciferol, vitamin D3, 4,000 unit cap Take by mouth.     ??? etonogestreL (NEXPLANON) 68 mg Impl 68 mg by Subdermal route once.     ??? glatiramer 40 mg/mL Syrg Inject the contents of 1 syringe (40 mg total) under the skin 3 (three) times a week.  12 mL 5   ??? gabapentin (NEURONTIN) 300 MG capsule Take 1 capsule (300 mg total) by mouth at bedtime. 30 capsule 2   ??? glipiZIDE (GLUCOTROL) 10 MG tablet Take 1 tablet (10 mg total) by mouth daily. 30 tablet 11   ??? metFORMIN (GLUCOPHAGE) 500 MG tablet Take 2 tablets (1,000 mg total) by mouth 2 (two) times a day with meals. Tome [employer] veces al dia para una semana, despues tome [employer] veces al dia 360 tablet 0     No current facility-administered medications for this visit.       ___________________________________  ALLERGIES:  No Known Allergies  ------------------------    PAST MEDICAL HISTORY:   Past Medical History:   Diagnosis Date   ??? Multiple sclerosis (CMS-HCC)    ??? Vitamin D deficiency          FMURGENTCARETRACKING       Did today's visit save an ED/Direct Admission?  no    PHQ-2 Score:  PHQ-2 Total Score : 0  PHQ-9 Score:       Screening complete, no depression identified / no further action needed today                [facility]  [address] [place], [place] [address] ??? Telephone [phone] ??? Fax [phone]  [email or web address]

## 2021-01-08 DIAGNOSIS — H469 Unspecified optic neuritis: Principal | ICD-10-CM

## 2021-01-08 NOTE — Unmapped (Signed)
Tiffany Meadows 's glatiramer shipment will be delayed as a result of insufficient inventory of the drug. Waiting to receive drug from manufacturer .    I have reached out to the patient  at [phone] and left a voicemail message.  We will call the patient back to reschedule the delivery upon resolution. We have not confirmed the new delivery date.

## 2021-01-09 DIAGNOSIS — H469 Unspecified optic neuritis: Principal | ICD-10-CM

## 2021-01-10 MED ORDER — GLATIRAMER 40 MG/ML SUBCUTANEOUS SYRINGE
SUBCUTANEOUS | 1 refills | 84.00000 days | Status: CP
Start: 2021-01-10 — End: ?
  Filled 2021-01-16: qty 36, 84d supply, fill #0

## 2021-01-18 DIAGNOSIS — H469 Unspecified optic neuritis: Principal | ICD-10-CM

## 2021-02-01 NOTE — Unmapped (Signed)
Patient has enough medication on hand, rescheduling refill call 10/4

## 2021-04-11 NOTE — Unmapped (Signed)
[facility] Clinical Assessment & Refill Coordination Note    Tiffany Meadows, Tiffany Meadows: August 09, 1985  Phone: [phone] (home)     All above HIPAA information was verified with patient's family member, son, Tiffany Meadows.     Was a Nurse, learning disability used for this call? No    Specialty Medication(s):   Neurology: glatiramer     Current Outpatient Medications   Medication Sig Dispense Refill   ??? amantadine HCL (SYMMETREL) 100 mg capsule TAKE 1 CAPSULE(100 MG) BY MOUTH EVERY MORNING 90 capsule 1   ??? cholecalciferol, vitamin D3, 4,000 unit cap Take by mouth.     ??? etonogestreL (NEXPLANON) 68 mg Impl 68 mg by Subdermal route once.     ??? gabapentin (NEURONTIN) 300 MG capsule Take 1 capsule (300 mg total) by mouth at bedtime. 30 capsule 2   ??? glatiramer 40 mg/mL Syrg Inject the contents of 1 syringe (40 mg total) under the skin 3 (three) times a week. 36 mL 1   ??? glipiZIDE (GLUCOTROL) 10 MG tablet Take 1 tablet (10 mg total) by mouth daily. 30 tablet 11   ??? metFORMIN (GLUCOPHAGE) 500 MG tablet Take 2 tablets (1,000 mg total) by mouth 2 (two) times a day with meals. Tome [employer] veces al dia para una semana, despues tome [employer] veces al dia 360 tablet 0     No current facility-administered medications for this visit.        Changes to medications: Tiffany Meadows reports no changes at this time.    No Known Allergies    Changes to allergies: No    SPECIALTY MEDICATION ADHERENCE     glatiramer 40 mg/ml: 21 days of medicine on hand     Medication Adherence    Patient reported X missed doses in the last month: 0  Specialty Medication: glatiramer  Patient is on additional specialty medications: No  Informant: child/children          Specialty medication(s) dose(s) confirmed: Regimen is correct and unchanged.     Are there any concerns with adherence? No    Adherence counseling provided? Not needed    CLINICAL MANAGEMENT AND INTERVENTION      Clinical Benefit Assessment:    Do you feel the medicine is effective or helping your condition? Yes    Clinical Benefit counseling provided? Not needed    Adverse Effects Assessment:    Are you experiencing any side effects? No    Are you experiencing difficulty administering your medicine? No    Quality of Life Assessment:    Quality of Life    Rheumatology  Oncology  Dermatology  Cystic Fibrosis          How many days over the past month did your MS  keep you from your normal activities? For example, brushing your teeth or getting up in the morning. 0    Have you discussed this with your provider? Not needed    Acute Infection Status:    Acute infections noted within Epic:  No active infections  Patient reported infection: None    Therapy Appropriateness:    Is therapy appropriate and patient progressing towards therapeutic goals? Yes, therapy is appropriate and should be continued    DISEASE/MEDICATION-SPECIFIC INFORMATION      For patients on injectable medications: Patient currently has 10 doses left.  Next injection is scheduled for 04/13/21.    PATIENT SPECIFIC NEEDS     - Does the patient have any physical,  cognitive, or cultural barriers? No    - Is the patient high risk? No    - Does the patient require a Care Management Plan? No     - Does the patient require physician intervention or other additional services (i.e. nutrition, smoking cessation, social work)? No      SHIPPING     Specialty Medication(s) to be Shipped:   Neurology: glatiramer    Other medication(s) to be shipped: No additional medications requested for fill at this time     Changes to insurance: No    Delivery Scheduled: Yes, Expected medication delivery date: 04/17/21.     Medication will be delivered via Same Day Courier to the confirmed prescription address in [facility].    The patient will receive a drug information handout for each medication shipped and additional FDA Medication Guides as required.  Verified that patient has previously received a Conservation officer, historic buildings and a Surveyor, mining.    The patient or caregiver noted above participated in the development of this care plan and knows that they can request review of or adjustments to the care plan at any time.      All of the patient's questions and concerns have been addressed.    Arnold Long   [facility] Specialty Pharmacist

## 2021-04-17 MED FILL — GLATIRAMER 40 MG/ML SUBCUTANEOUS SYRINGE: SUBCUTANEOUS | 84 days supply | Qty: 36 | Fill #1

## 2021-05-15 ENCOUNTER — Ambulatory Visit: Admit: 2021-05-15 | Discharge: 2021-05-15 | Attending: Physician Assistant | Primary: Physician Assistant

## 2021-05-15 ENCOUNTER — Ambulatory Visit: Admit: 2021-05-15 | Discharge: 2021-05-15

## 2021-05-15 DIAGNOSIS — M792 Neuralgia and neuritis, unspecified: Principal | ICD-10-CM

## 2021-05-15 DIAGNOSIS — H469 Unspecified optic neuritis: Principal | ICD-10-CM

## 2021-05-15 DIAGNOSIS — G379 Demyelinating disease of central nervous system, unspecified: Principal | ICD-10-CM

## 2021-05-15 MED ORDER — GABAPENTIN 300 MG CAPSULE
ORAL_CAPSULE | Freq: Every evening | ORAL | 5 refills | 30 days | Status: CP
Start: 2021-05-15 — End: ?

## 2021-05-15 NOTE — Unmapped (Signed)
[facility] [place] [facility] at [facility]  Multiple Sclerosis / Neuroimmunology Division  Jetta Murray Julieanne Cotton [facility]  Physician Assistant    Phone: [phone]  Fax: [phone]      Patient Name: Tiffany Meadows   Date of Birth: 09-Mar-1986  Medical Record Number: [id number]  [address] [place]  [place] [address]     Direct entry by:  Cy Blamer, PA-C.  Supervising Physician: Dr. Desma Mcgregor, Dr. Quillian Quince.    DATE OF VISIT: May 15, 2021    REASON FOR VISIT: Followup in the Neuroimmunology Clinic for evaluation of Multiple Sclerosis / Demyelinating Disease / or other Neurological problem.    **I personally spent 30 minutes face-to-face and non-face-to-face in the care of this patient, which includes all pre, intra, and post visit time on the date of service.      Last seen by Dr. Johnnye Lana 08/18/2018.    SPANISH INTERPRETOR SERVICE WAS USED via phone:    ASSESSMENT AND PLAN:  ** Possible Multiple Sclerosis.  -Glitarimer started 12/16/2017. Contine.   -Reviewed  with the patient 11/05/2020 yearly MRI reports of Brain, Cervical  and Thoracic spine,  with / without contrast,  compared to 09/14/2019  which showed no changes in the brain and no spinal cord lesions.  -Schedule yearly MRI of the  Brain, Cervical  and Thoracic spine without contrast  Schedule for  10/2021.    **Vitamin D:  -Check Vitamin D 25-OH at any [facility] outpatient lab.   -Continue D3 4,000 units daily.    **Fatigue:  -Continue Amantadine.    **Arm and foot pain:  -Continue Gabapentin 300 mg at bedtime.    -Follow up in 6 months.    INTERVAL HISTORY / CHIEF COMPLAINT :  Patient reports no MS relapses / sustained progression since the last visit 10/04/2020.  Takes Gabapentin 300 mg q HS for pain foot pain and arms and headaches. Prescribed initaily by OSH.    PHYSICAL EXAMINATION:  GENERAL:  Alert and oriented to person, place, time and situation.    Recent and remote memory intact.      Neurological Examination:   Cranial Nerves:   II, III- Pupils are equal 2 mm and reactive to light b/l.  Visual Acuity:   OD 20/20, OS 20/20.  III, IV, VI- extra ocular movements are intact, No ptosis, no nystagmus.  V- sensation of the face intact b/l.  VII- face symmetrical, no facial droop, normal facial movements with smile/grimace  VIII- Hearing grossly intact.  IX and X- symmetric palate contraction, normal gag bilaterally  XI- Full shoulder shrug bilaterally  XII- Tongue protrudes midline, full range of movements of the tongue    Motor Exam:      Muscles UEs   LEs     R L   R L   Deltoids 5/5 5/5 Hip flexors  ** 5/5   Biceps 5/5 5/5 Hip extensors ** 5/5   Triceps 5/5 5/5 Knee flexors 5/5 5/5   Hand grip 5/5 5/5 Knee extensors 5/5 5/5      Foot dorsal flexors 5/5 5/5      Foot plantar flexors 5/5 5/5      Strength limited due to hip pain,  Normal bulk and tone.  No clonus.    Reflexes R L   Brachioradialis +2 +2   Biceps +2 +2   Triceps +2 +2   Patella +2 +2   Achilles +2 +  2     Negative babinski.    Sensory UEs LEs    R L R L   Light touch WNL WNL WNL WNL   Pin prick WNL WNL WNL WNL   Vibration WNL WNL WNL WNL   Proprioception   WNL WNL        Cerebellar/Coordination:  Rapid alternating movements, finger-to-nose and heel-to-shin  bilaterally demonstrates no abnormalities.    Romberg was negative with eyes closed.  Gait: Slight limp due to right hip pain. Pain is reproducible on palpation of posterior, lateral  right hip. No deformities felt. FROM of right hip but painful.  Able to tandem, heel, and toe gait.     PRIOR HISTORY: A 35 y.o. female??with no PMH who presented??in the first week of 03/2017 with vision loss bilaterally for one week with eye pain??and abnormal MRI that showed bilateral optic neuritis. The patient was placed on IV Solu-Medrol. MRI of the brain was concerning for possible demyelinating disease. The patient was seen in consultation by [place] neurology who recommended continuing IV solumedrol.   Her vision improved significantly with IV 3 doses of Solu-Medrol. She denies prior neurological episodes and her vision recovered completely. Continues to have  residual left leg hypesthesia.   She denies miscarriages, hiccups and vomiting episodes for no clear reason.     Her diagnosis is possible MS: She is NMO-IgG and MOG-IgG negative.  Her CSF studies show mild pleocytosis with no IgG OCBs. An autoimmune panel to screen for other conditions that may give bilateral optic neuritis was unremarkable.  She has no spinal cord lesions and one small subcortical and non -specific brain MRI lesion.     The patient states she dos not qualify for [facility] and for financial reasons therefore only performed brain, C/T spine MRI at the next MRI follow-up.   ??  Diagnoses with Sacroiliitis.   Xrays showed Mild osteoarthrosis of the pubic symphysis and Mild facet joint narrowing and sclerosis is present at L4-5 and L5-S1 bilaterally.    DMD History:  Glatiramer acetate:  12/26/2017-ongoing  ????  MRI: REPORTS:  09/04/2018 MRI of the brain with / without contrast. report compared to 04/03/2018: increased T2/FLAIR signal. Several of these lesions were seen on the preceding exam, but some more not. The previous exam demonstrated significant motion. Therefore, I am uncertain if there are new lesions or just poorly visualized lesions previously. Recommend follow-up for further evaluation, and consider sedation for future exams. ?? No enhancing lesions are seen. Increased T2 signal within the retrobulbar and in the canalicular segments of the bilateral optic nerves. No demonstrable enhancement on postcontrast images.     04/03/2017: Brain MRI with and w/o contrast, report: 1. Bilateral optic neuritis.  2. Single FLAIR hyperintense focus superficial left frontal white matter. While appearance is nonspecific, presence of a white matter lesion in the setting of optic neuritis raises concern for demyelinating disease.    07/10/2017: C/T  spine MRI with and w/o contrast, report: --No convincing evidence of demyelinating lesions within the cervical or thoracic spinal cord. --Mild degenerative disc disease at C5-6 and C6-7 without high-grade canal or neural foraminal stenosis.--Bone marrow signal is diffusely mildly T1-hypointense, possibly reflective of anemia.  ??  Lumbar puncture:  ??? IgG, CSF 04/05/2017 2.4  0.5 - 7.0 mg/dL Final   ??? Albumin, CSF 04/05/2017 17.1  5.0 - 35.7 mg/dL Final   ??? Albumin, Serum 04/04/2017 4400  3,500-5,000 mg/dL Final   ??? CSF IGG Index 04/04/2017 0.5  0.3 - 0.8 Final   ??? CSF Oligoclonal Bands 04/04/2017  negative negative Final   ??? Ratio Interpretation 04/04/2017  normal ?? Final   ??? Albumin 04/04/2017 4.4  3.5 - 5.0 g/dL Final   ??? Total IgG 09/81/1914 1201  600-1,700 mg/dL Final   ??? Tube # CSF 04/05/2017 Tube 1  ?? Final   ??? Color, CSF 04/05/2017 Colorless  ?? Final   ??? Appearance, CSF 04/05/2017 Clear  ?? Final   ??? Nucleated Cells, CSF 04/05/2017 8* 0 - 5 ul Final   ??? RBC, CSF 04/05/2017 58* 0 ul Final   ??? Neutrophil %, CSF 04/05/2017 1.0  0.0 - 6.0 % Final   ??? Lymphs %, CSF 04/05/2017 85.0* 40.0 - 80.0 % Final   ??? Mono/Macrophage %, CSF 04/05/2017 14.0* 15.0 - 45.0 % Final   ??? Tube # CSF 04/05/2017 Tube 4  ?? Final   ??? Color, CSF 04/05/2017 Colorless  ?? Final   ??? Appearance, CSF 04/05/2017 Clear  ?? Final   ??? Nucleated Cells, CSF 04/05/2017 13* 0 - 5 ul Final   ??? RBC, CSF 04/05/2017 2* 0 ul Final   ??? Lymphs %, CSF 04/05/2017 98.0* 40.0 - 80.0 % Final   ??? Mono/Macrophage %, CSF 04/05/2017 2.0* 15.0 - 45.0 % Final   ??? Protein, CSF 04/05/2017 29  15 - 45 mg/dL Final   ??? Glucose, CSF 04/05/2017 86* 40 - 70 mg/dL Final   ??? CSF cytology 04/05/2017 -Mild inflammatory pleocytosis, negative for malignant cells. ?? Final   ??    REVIEW OF SYSTEMS:  A 10-systems review was performed and, unless otherwise noted, declared negative by patient.    Current Outpatient Medications   Medication Sig Dispense Refill   ??? amantadine HCL (SYMMETREL) 100 mg capsule TAKE 1 CAPSULE(100 MG) BY MOUTH EVERY MORNING 90 capsule 1   ??? cholecalciferol, vitamin D3, 4,000 unit cap Take by mouth.     ??? etonogestreL (NEXPLANON) 68 mg Impl 68 mg by Subdermal route once.     ??? gabapentin (NEURONTIN) 300 MG capsule Take 1 capsule (300 mg total) by mouth at bedtime. 30 capsule 2   ??? glatiramer 40 mg/mL Syrg Inject the contents of 1 syringe (40 mg total) under the skin 3 (three) times a week. 36 mL 1   ??? glipiZIDE (GLUCOTROL) 10 MG tablet Take 1 tablet (10 mg total) by mouth daily. 30 tablet 11   ??? metFORMIN (GLUCOPHAGE) 500 MG tablet Take 2 tablets (1,000 mg total) by mouth 2 (two) times a day with meals. Tome [employer] veces al dia para una semana, despues tome [employer] veces al dia 360 tablet 0     No current facility-administered medications for this visit.       Patient Active Problem List   Diagnosis   ??? Bilateral optic neuritis   ??? Abnormal brain MRI   ??? Vitamin D deficiency   ??? Elevated LFTs   ??? History of gestational diabetes mellitus (GDM)   ??? Illiteracy   ??? Overweight   ??? Fatty liver   ??? Neuropathic pain   ??? Demyelinating disease of central nervous system, unspecified (CMS-HCC)   ??? Chronic fatigue   ??? Arthralgia   ??? Type 2 diabetes mellitus without complication, without long-term current use of insulin (CMS-HCC)   ??? Health care maintenance       No Known Allergies    Social History     Socioeconomic History   ???  Marital status: Married   Tobacco Use   ??? Smoking status: Never   ??? Smokeless tobacco: Never   Substance and Sexual Activity   ??? Alcohol use: No   ??? Drug use: No   ??? Sexual activity: Yes     Partners: Male

## 2021-05-15 NOTE — Unmapped (Addendum)
Below is your visit summary:    **Have labs drawn today.    [facility] Outpatient lab  [facility]   [address] [place], [place] [address]  Monday - Friday 8 am - 5 pm    **Schedule your MRI's 10/2021. Someone will call you to schedule these.    **Follow up 6 months.   The [place] Home Modification [employer] program for individuals living with MS is now open through December 28, 2021 (flyer attached). Grants are based on meeting eligibility criteria and the availability of funds at the time of a completed application. Please note that these funds can be used for air conditioning.     Link to details and the online application: [place] Modification Grant. MS Navigators are standing by at [phone] to answer questions and support your patients through the application process.    Navigating the Challenges of MS: MS Navigator??   National MS Society: MS Navigator at [phone]  Finding answers and making decisions relies on having the right information at the right time. MS Navigator are highly skilled, compassionate professionals available Monday through Friday, 9:00 a.m. to 7:00 p.m. ET to connect you to the information, resources and support needed to move your life forward. These supportive partners help navigate the challenges of MS unique to your situation.    Miracle Mongillo Jannett Celestine PA-C, MPAS  Division of Multiple Sclerosis    **NEW ADDRESS [facility] at [facility]  [address]  [place], [place] [address]    Phone [phone]  Fax [phone]    [facility] Neurology MS Clinic is a specialty clinic and there is a need for you to have a  primary care provider  who will take care of your non-neurological health.   Please set this up if you have not already done so.      In case of:  a suspected relapse (new symptoms or worsening existing symptoms, lasting for >24h)  OR  a need for an additional appointment for other reasons  OR   if you have other questions: please contact:       [facility] Neurology [facility] Desk   Phone: [phone]      If you have questions for our  pharmacist, please call:      Worthy Flank, PharmD, CPP  Phone: [phone]      If you need financial assistance, please call:      Financial Assistance Unit      Phone: [phone]

## 2021-05-16 MED ORDER — GLATIRAMER 40 MG/ML SUBCUTANEOUS SYRINGE
SUBCUTANEOUS | 1 refills | 84 days | Status: CP
Start: 2021-05-16 — End: ?
  Filled 2021-07-26: qty 36, 84d supply, fill #0

## 2021-05-18 LAB — VITAMIN D 25 HYDROXY: VITAMIN D, TOTAL (25OH): 29.5 ng/mL (ref 20.0–80.0)

## 2021-07-24 NOTE — Unmapped (Signed)
[facility] Refill Coordination Note    Specialty Medication(s) to be Shipped:   Neurology: glatiramer    Other medication(s) to be shipped: No additional medications requested for fill at this time     Tiffany Meadows, DOB: September 15, 1985  Phone: [phone] (home)       All above HIPAA information was verified with patient's family member, son-Tiffany Meadows.     Was a Nurse, learning disability used for this call? No    Completed refill call assessment today to schedule patient's medication shipment from the [facility] [phone]).  All relevant notes have been reviewed.     Specialty medication(s) and dose(s) confirmed: Regimen is correct and unchanged.   Changes to medications: [name] reports no changes at this time.  Changes to insurance: No  New side effects reported not previously addressed with a pharmacist or physician: None reported   Questions for the pharmacist: No    Confirmed patient received a Conservation officer, historic buildings and a Surveyor, mining with first shipment. The patient will receive a drug information handout for each medication shipped and additional FDA Medication Guides as required.       DISEASE/MEDICATION-SPECIFIC INFORMATION        N/A    SPECIALTY MEDICATION ADHERENCE     Medication Adherence    Patient reported X missed doses in the last month: 0  Specialty Medication: glatiramer 40mg /ml  Patient is on additional specialty medications: No  Patient is on more than two specialty medications: No  Any gaps in refill history greater than 2 weeks in the last 3 months: no  Demonstrates understanding of importance of adherence: yes  Informant: patient  Reliability of informant: reliable  Provider-estimated medication adherence level: good  Patient is at risk for Non-Adherence: No  Reasons for non-adherence: no problems identified  Confirmed plan for next specialty medication refill: delivery by pharmacy  Refills needed for supportive medications: not needed          Refill Coordination    Has the Patients' Contact Information Changed: No  Is the Shipping Address Different: No         Were doses missed due to medication being on hold? No    Glatiramer 40 mg/ml: 14 days of medicine on hand         REFERRAL TO PHARMACIST     Referral to the pharmacist: Not needed      [facility]     Shipping address confirmed in Epic.     Delivery Scheduled: Yes, Expected medication delivery date: 01/26.     Medication will be delivered via Same Day Courier to the prescription address in Epic WAM.    Antonietta Barcelona   [facility] Specialty Technician

## 2021-07-24 NOTE — Unmapped (Signed)
The [facility] has made a third and final attempt to reach this patient to refill the following medication:Glatiramer.      We have left voicemails on the following phone numbers: [phone]; [phone] and have sent a MyChart message.    Dates contacted: 1/13, 1/17, 1/24  Last scheduled delivery: 10/18    The patient may be at risk of non-compliance with this medication. The patient should call the [facility] at [phone]  Option 4, then Option 2 (all other specialty patients) to refill medication.    Tiffany Meadows   [facility] Specialty Technician

## 2021-08-21 NOTE — Unmapped (Signed)
Patient has enough medication on hand, rescheduling refill call for 4/6

## 2021-08-25 ENCOUNTER — Emergency Department: Admit: 2021-08-25 | Discharge: 2021-08-26 | Disposition: A | Discharge status: Home / Self Care | Payer: MEDICARE

## 2021-08-25 ENCOUNTER — Ambulatory Visit: Admit: 2021-08-25 | Discharge: 2021-08-26 | Disposition: A | Discharge status: Home / Self Care | Payer: MEDICARE

## 2021-08-25 DIAGNOSIS — G35 Multiple sclerosis: Principal | ICD-10-CM

## 2021-08-25 LAB — CBC W/ AUTO DIFF
BASOPHILS ABSOLUTE COUNT: 0 10*9/L (ref 0.0–0.1)
BASOPHILS RELATIVE PERCENT: 0.4 %
EOSINOPHILS ABSOLUTE COUNT: 0.2 10*9/L (ref 0.0–0.5)
EOSINOPHILS RELATIVE PERCENT: 3 %
HEMATOCRIT: 39 % (ref 34.0–44.0)
HEMOGLOBIN: 13.5 g/dL (ref 11.3–14.9)
LYMPHOCYTES ABSOLUTE COUNT: 1.9 10*9/L (ref 1.1–3.6)
LYMPHOCYTES RELATIVE PERCENT: 23.6 %
MEAN CORPUSCULAR HEMOGLOBIN CONC: 34.5 g/dL (ref 32.0–36.0)
MEAN CORPUSCULAR HEMOGLOBIN: 30 pg (ref 25.9–32.4)
MEAN CORPUSCULAR VOLUME: 86.9 fL (ref 77.6–95.7)
MEAN PLATELET VOLUME: 9.3 fL (ref 6.8–10.7)
MONOCYTES ABSOLUTE COUNT: 0.4 10*9/L (ref 0.3–0.8)
MONOCYTES RELATIVE PERCENT: 4.6 %
NEUTROPHILS ABSOLUTE COUNT: 5.5 10*9/L (ref 1.8–7.8)
NEUTROPHILS RELATIVE PERCENT: 68.4 %
PLATELET COUNT: 321 10*9/L (ref 150–450)
RED BLOOD CELL COUNT: 4.49 10*12/L (ref 3.95–5.13)
RED CELL DISTRIBUTION WIDTH: 13.3 % (ref 12.2–15.2)
WBC ADJUSTED: 8.1 10*9/L (ref 3.6–11.2)

## 2021-08-25 LAB — COMPREHENSIVE METABOLIC PANEL
ALBUMIN: 4.3 g/dL (ref 3.4–5.0)
ALKALINE PHOSPHATASE: 91 U/L (ref 46–116)
ALT (SGPT): 74 U/L — ABNORMAL HIGH (ref 10–49)
ANION GAP: 7 mmol/L (ref 3–11)
AST (SGOT): 40 U/L — ABNORMAL HIGH (ref <34)
BILIRUBIN TOTAL: 0.6 mg/dL (ref 0.3–1.2)
BLOOD UREA NITROGEN: 12 mg/dL (ref 9–23)
BUN / CREAT RATIO: 20
CALCIUM: 9.3 mg/dL (ref 8.7–10.4)
CHLORIDE: 103 mmol/L (ref 98–107)
CO2: 28 mmol/L (ref 20.0–31.0)
CREATININE: 0.61 mg/dL (ref 0.50–0.80)
EGFR CKD-EPI (2021) FEMALE: >90 mL/min/{1.73_m2} (ref >=60)
GLUCOSE RANDOM: 218 mg/dL — ABNORMAL HIGH (ref 70–179)
POTASSIUM: 3.6 mmol/L (ref 3.5–5.1)
PROTEIN TOTAL: 7.4 g/dL (ref 5.7–8.2)
SODIUM: 138 mmol/L (ref 136–145)

## 2021-08-25 LAB — SEDIMENTATION RATE: ERYTHROCYTE SEDIMENTATION RATE: 7 mm/h (ref 0–20)

## 2021-08-25 MED ORDER — PREDNISONE 20 MG TABLET
ORAL_TABLET | 0 refills | 0 days | Status: CP
Start: 2021-08-25 — End: ?

## 2021-08-26 MED ADMIN — oxyCODONE-acetaminophen (PERCOCET) 5-325 mg tablet 1 tablet: 1 | ORAL | Stop: 2021-08-25

## 2021-08-26 MED ADMIN — methylPREDNISolone sodium succinate (PF) (Solu-MEDROL) 1,000 mg in dextrose 5 % 100 mL IVPB: 1000 mg | INTRAVENOUS | @ 01:00:00 | Stop: 2021-08-25

## 2021-08-26 MED ADMIN — ondansetron (ZOFRAN) injection 4 mg: 4 mg | INTRAVENOUS | Stop: 2021-08-25

## 2021-08-26 NOTE — Unmapped (Signed)
EMERGENCY DEPARTMENT ENCOUNTER      ROOM NUMBER:  [id number]    CHIEF COMPLAINT    Chief Complaint   Patient presents with   ??? Head Pain       HPI    Tiffany Meadows is a 36 y.o. female with history of MS presents with a flare.  Patient states her last flare was about a year ago.  She was treated with a prednisone taper.  She has a neurologist that she follows up with.  She states she developed head pain pain in her left arm and left neck consistent with her MS flare.  No fevers no chills.  No visual changes no trauma  PCP: Landry Dyke, MD    OUTSIDE DATA REVIEW AND SUMMARY  I have reviewed the results of tests ordered by a provider other than myself, including: I reviewed MRI from 11/05/2020 which showed no change in frontal lobe demyelination  lesion  I have reviewed outside medical records.  I reviewed *neurology note see excerpt:  ??  ASSESSMENT AND PLAN:  ** Possible Multiple Sclerosis.  -Glitarimer started 12/16/2017. [place].   -Reviewed  with the patient 11/05/2020 yearly MRI reports of Brain, Cervical  and Thoracic spine,  with / without contrast,  compared to 09/14/2019  which showed no changes in the brain and no spinal cord lesions.  -Schedule yearly MRI of the  Brain, Cervical  and Thoracic spine without contrast  Schedule for  10/2021.  ??  **Vitamin D:  -Check Vitamin D 25-OH at any [facility] outpatient lab.   -Continue D3 4,000 units daily.  ??  **Fatigue:  -Continue Amantadine.  ??  **Arm and foot pain:  -Continue Gabapentin 300 mg at bedtime.  ??    PAST MEDICAL HISTORY    Past Medical History:   Diagnosis Date   ??? Multiple sclerosis (CMS-HCC)    ??? Vitamin D deficiency        SURGICAL HISTORY    Past Surgical History:   Procedure Laterality Date   ??? BREAST BIOPSY Left 2013   ??? CESAREAN SECTION         CURRENT MEDICATIONS    No current facility-administered medications for this encounter.    Current Outpatient Medications:   ???  amantadine HCL (SYMMETREL) 100 mg capsule, TAKE 1 CAPSULE(100 MG) BY MOUTH EVERY MORNING, Disp: 90 capsule, Rfl: 1  ???  cholecalciferol, vitamin D3, 4,000 unit cap, Take by mouth., Disp: , Rfl:   ???  etonogestreL (NEXPLANON) 68 mg Impl, 68 mg by Subdermal route once., Disp: , Rfl:   ???  gabapentin (NEURONTIN) 300 MG capsule, Take 1 capsule (300 mg total) by mouth at bedtime., Disp: 30 capsule, Rfl: 5  ???  glatiramer 40 mg/mL Syrg, Inject the contents of 1 syringe (40 mg total) under the skin 3 (three) times a week., Disp: 36 mL, Rfl: 1  ???  glipiZIDE (GLUCOTROL) 10 MG tablet, Take 1 tablet (10 mg total) by mouth daily. (Patient not taking: Reported on 05/15/2021), Disp: 30 tablet, Rfl: 11  ???  metFORMIN (GLUCOPHAGE) 500 MG tablet, Take 2 tablets (1,000 mg total) by mouth 2 (two) times a day with meals. Tome [employer] veces al dia para una semana, despues tome [employer] veces al dia, Disp: 360 tablet, Rfl: 0  ???  predniSONE (DELTASONE) 20 MG tablet, 3 po daily for 2 days, then 2 po daily for 2 days, then 1 po daily for 2 days, Disp: 12 tablet, Rfl: 0  ALLERGIES    No Known Allergies    FAMILY HISTORY    Non-contributory    SOCIAL HISTORY    Social History     Socioeconomic History   ??? Marital status: Married   Tobacco Use   ??? Smoking status: Never   ??? Smokeless tobacco: Never   Substance and Sexual Activity   ??? Alcohol use: No   ??? Drug use: No   ??? Sexual activity: Yes     Partners: Male     Landry Dyke, MD    REVIEW OF SYSTEMS    A 12 point review of systems has been performed and is negative except as stated in my history of present illness.    PHYSICAL EXAM    VITAL SIGNS:   Patient Vitals for the past 24 hrs:   BP Pulse SpO2   08/25/21 2057 115/69 68 96 %     General:  No acute distress.   HENT:  NCAT.  Oropharynx grossly normal.  Eyes:  Normal appearance, no icterus noted.  Neck:  Supple, normal ROM.  Heart:  Regular rate and rhythm. 2+ symmetrical pulses .  Lungs: Clear to auscultation bilaterally, nonlabored breathing .  Abdomen: Soft, non-tender, normal bowel sounds, no tenderness to deep palpation in any quadrant. A benign reassuring non-surgical exam.  Rectal: deferred  Back: No CVA tenderness. No TLS spine tenderness  Extremities: Warm and well perfused. No obvious deformity.  Grossly normal ROM about all joints.  Skin:  Warm, dry, no rash.  Lymphatic:  No abnormal swelling/LA noted.  Neuro: Subjective numbness and tingling in the left arm. no weakness demonstrated on exam  Psych:  Mental status and affect normal, normal speech pattern and content.     EKG    Encounter Date: 08/25/21   ECG 12 Lead   Result Value    EKG Systolic BP     EKG Diastolic BP     EKG Ventricular Rate 66    EKG Atrial Rate 66    EKG P-R Interval 150    EKG QRS Duration 104    EKG Q-T Interval 396    EKG QTC Calculation 415    EKG Calculated P Axis 65    EKG Calculated R Axis 78    EKG Calculated T Axis 45    QTC Fredericia 409    Narrative    NORMAL SINUS RHYTHM  INCOMPLETE RIGHT BUNDLE BRANCH BLOCK  BORDERLINE ECG  WHEN COMPARED WITH ECG OF 04-Apr-2017 14:54,  INCOMPLETE RIGHT BUNDLE BRANCH BLOCK IS NOW PRESENT  Confirmed by Jaclynn Guarneri ([id number]) on 08/26/2021 1:46:37 PM       T  RESULTS   Labs Reviewed   COMPREHENSIVE METABOLIC PANEL - Abnormal; Notable for the following components:       Result Value    Glucose 218 (*)     AST 40 (*)     ALT 74 (*)     All other components within normal limits   POCT GLUCOSE, INTERFACED - Abnormal; Notable for the following components:    Glucose, POC 212 (*)     All other components within normal limits   SEDIMENTATION RATE - Normal   CBC W/ DIFFERENTIAL    Narrative:     The following orders were created for panel order CBC w/ Differential.  Procedure  Abnormality         Status                     ---------                               -----------         ------                     CBC w/ Differential[[id number]]                             Final result                 Please view results for these tests on the individual orders.   CBC W/ AUTO DIFF       CT Head Wo Contrast   Final Result        All imaging studies have been independently reviewed by me and my results are: No acute findings  ED COURSE & MEDICAL DECISION MAKING    7:16 PM: Patient initially assessed. Tiffany Meadows is a 36 y.o. female who presents with MS flare.  I will start high-dose Solu-Medrol.  I will check a head CT to make sure there is no acute intracranial pathology that requires emergent intervention.  I will check baseline labs and I will determine her disposition based on ER eval    I reviewed all of her records.  I have discussed the case with our neuro hospitalist.  Our neuro hospitalist recommends outpatient management with a steroid taper.  He is reassured with the low sed rate and a normal head CT and he feels she should do well as an outpatient.  She will be written for a steroid taper.  She is instructed to follow-up with her neurologist on Monday    {T   MEDS GIVEN IN ED:  Medications   oxyCODONE-acetaminophen (PERCOCET) 5-325 mg tablet 1 tablet (1 tablet Oral Given 08/25/21 1909)   ondansetron (ZOFRAN) injection 4 mg (4 mg Intravenous Given 08/25/21 1912)   methylPREDNISolone sodium succinate (PF) (Solu-MEDROL) 1,000 mg in dextrose 5 % 100 mL IVPB (0 mg Intravenous Stopped 08/25/21 2050)       FINAL IMPRESSION    1. Multiple sclerosis (CMS-HCC)          Discharge Medications  Discharge Medication List as of 08/25/2021  8:53 PM      START taking these medications    Details   predniSONE (DELTASONE) 20 MG tablet 3 po daily for 2 days, then 2 po daily for 2 days, then 1 po daily for 2 days, Normal             The patient was instructed to follow-up as listed below:  Karle Starch, MD  [address]  [place] [address]  [phone]    Call   As needed      Disclaimer: This chart has been created using Occupational hygienist. Chart creation errors have been sought, but may not always be located and corrected and such creation errors do NOT reflect on the standard of medical care.     Maralyn Sago, MD  08/26/21 1843

## 2021-08-26 NOTE — Unmapped (Signed)
Hispanic speaking. Jari Favre #  [id number], interpreter  C/o pain to back of head with pain radiating to left arm x 3 days.   Hx MS, DM

## 2021-08-26 NOTE — Unmapped (Signed)
RX to preferred pharmacy.  Verbalizes DC instructions and return precautions.  Ambulatory with stable gait.  Home with family.

## 2021-10-05 ENCOUNTER — Ambulatory Visit: Admit: 2021-10-05 | Discharge: 2021-10-06 | Attending: Family Medicine | Primary: Family Medicine

## 2021-10-05 MED ORDER — GABAPENTIN 300 MG CAPSULE
ORAL_CAPSULE | Freq: Every evening | ORAL | 11 refills | 30 days | Status: CP
Start: 2021-10-05 — End: ?

## 2021-10-05 MED ORDER — METFORMIN 500 MG TABLET
ORAL_TABLET | 3 refills | 0 days | Status: CP
Start: 2021-10-05 — End: ?

## 2021-10-05 NOTE — Unmapped (Signed)
Assessment/Plan: Tiffany Meadows is a 36 y.o. female with Multiple Sclerosis and Diabetes    A1C 7.8, increase metformin to 2500 mg/D (2 AM 3 PM)    Tiffany Meadows was seen today for multiple sclerosis and diabetes.    Diagnoses and all orders for this visit:    Type 2 diabetes mellitus without complication, without long-term current use of insulin (CMS-HCC)  -     metFORMIN (GLUCOPHAGE) 500 MG tablet; 2 pills AM and 3 pills PM, take with food (Spanish)  - Increased from 1000 BID. A1C 7.8. Would consider Jardiance but self-pay. Could see if she qualifies for patient assistance program.   - FU 3 mos.     Neuropathic pain  -     gabapentin (NEURONTIN) 300 MG capsule; Take 1 capsule (300 mg total) by mouth at bedtime.    MS (multiple sclerosis) (CMS-HCC)        -  Followed by Dr. Evalee Jefferson. On glatiramer.      Other orders  -     POCT Glycosylated Hemoglobin (HGB A1c)        PHQ-2 Score:      PHQ-9 Score: 1    Edinburgh Score:      Screening complete, no depression identified / no further action needed today    HPI: Tiffany Meadows is a 36 y.o. female with   Past Medical History:   Diagnosis Date   ??? Multiple sclerosis (CMS-HCC)    ??? Vitamin D deficiency      Seen in ED for neuro sx 2 mos ago. Told to follow up with PCP. BS was 215 in ED. Also put on 5 days of pred. Has not had FU for DM in almost 2 years. Had visit to UC and metformin RF provided. No concerns today. Medlist reviewed and updated.     Comprehensive 10 point ROS negative except as above.    I have reviewed and if necessary updated the PMH, PSH, Medications, and Allergies in Epic. SH and FH reviewed and updated in Epic.    Physical Exam:    Wt Readings from Last 3 Encounters:   10/05/21 69.4 kg (153 lb)   08/25/21 53 kg (116 lb 13.5 oz)   05/15/21 70.5 kg (155 lb 8 oz)     Temp Readings from Last 3 Encounters:   10/05/21 36.7 ??C (98 ??F) (Temporal)   08/25/21 36.8 ??C (98.2 ??F) (Oral)   01/03/21 36.2 ??C (97.1 ??F) (Temporal)     BP Readings from Last 3 Encounters:   10/05/21 121/73   08/25/21 115/69   05/15/21 110/58     Pulse Readings from Last 3 Encounters:   10/05/21 62   08/25/21 68   05/15/21 66     Estimated body mass index is 29.26 kg/m?? as calculated from the following:    Height as of this encounter: 154 cm (5' 0.63).    Weight as of this encounter: 69.4 kg (153 lb).  Facility age limit for growth percentiles is 20 years.    General appearance: alert, appears stated age, cooperative and no distress  Neck: no adenopathy, no carotid bruit, no JVD, supple, symmetrical, trachea midline and thyroid not enlarged, symmetric, no tenderness/mass/nodules  Lungs: clear to auscultation bilaterally  Heart: regular rate and rhythm, S1, S2 normal, no murmur, click, rub or gallop  Extremities: extremities normal, atraumatic, no cyanosis or edema    Discharge Medications:  has a current medication list which includes the following prescription(s): amantadine hcl, cholecalciferol (  vitamin d3), etonogestrel, glatiramer, gabapentin, and metformin.

## 2021-10-08 NOTE — Unmapped (Signed)
[facility] Clinical Assessment & Refill Coordination Note    Tiffany Meadows, [place]: October 20, 1985  Phone: [phone] (home)     All above HIPAA information was verified with patient.     Was a Nurse, learning disability used for this call? Yes, Spanish. Patient language is appropriate in [facility]    Specialty Medication(s):   Neurology: glatiramer     Current Outpatient Medications   Medication Sig Dispense Refill   ??? amantadine HCL (SYMMETREL) 100 mg capsule TAKE 1 CAPSULE(100 MG) BY MOUTH EVERY MORNING 90 capsule 1   ??? cholecalciferol, vitamin D3, 4,000 unit cap Take by mouth.     ??? etonogestreL (NEXPLANON) 68 mg Impl 1 each (68 mg total) by Subdermal route once.     ??? gabapentin (NEURONTIN) 300 MG capsule Take 1 capsule (300 mg total) by mouth at bedtime. 30 capsule 11   ??? glatiramer 40 mg/mL Syrg Inject the contents of 1 syringe (40 mg total) under the skin 3 (three) times a week. 36 mL 1   ??? metFORMIN (GLUCOPHAGE) 500 MG tablet 2 pills AM and 3 pills PM, take with food (Spanish) 450 tablet 3     No current facility-administered medications for this visit.        Changes to medications: Batya reports no changes at this time.    No Known Allergies    Changes to allergies: No    SPECIALTY MEDICATION ADHERENCE     glatiramer 40 mg/ml: 35 days days of medicine on hand       Medication Adherence    Patient reported X missed doses in the last month: >5  Specialty Medication: glatiramer          Specialty medication(s) dose(s) confirmed: Regimen is correct and unchanged.     Are there any concerns with adherence? Yes: patient missed several weeks of medicine because she traveled to [place] and did not bring enough medicine with her.    Adherence counseling provided? Yes: Reminded patient to bring enough medicine for her trip.    CLINICAL MANAGEMENT AND INTERVENTION      Clinical Benefit Assessment:    Do you feel the medicine is effective or helping your condition? Yes    Clinical Benefit counseling provided? Not needed    Adverse Effects Assessment:    Are you experiencing any side effects? No    Are you experiencing difficulty administering your medicine? No    Quality of Life Assessment:         How many days over the past month did your condition  keep you from your normal activities? For example, brushing your teeth or getting up in the morning. 1    Have you discussed this with your provider? Yes    Acute Infection Status:    Acute infections noted within Epic:  No active infections  Patient reported infection: None    Therapy Appropriateness:    Is therapy appropriate and patient progressing towards therapeutic goals? Yes, therapy is appropriate and should be continued    DISEASE/MEDICATION-SPECIFIC INFORMATION      N/A    PATIENT SPECIFIC NEEDS     - Does the patient have any physical, cognitive, or cultural barriers? No    - Is the patient high risk? No    - Does the patient require a Care Management Plan? No     SOCIAL DETERMINANTS OF HEALTH     At the [facility], we have learned that life circumstances - like  trouble affording food, housing, utilities, or transportation can affect the health of many of our patients.   That is why we wanted to ask: are you currently experiencing any life circumstances that are negatively impacting your health and/or quality of life? Patient declined to answer    Social Determinants of Health     Financial Resource Strain: Not on file   Internet Connectivity: Not on file   Food Insecurity: Not on file   Tobacco Use: Low Risk    ??? Smoking Tobacco Use: Never   ??? Smokeless Tobacco Use: Never   ??? Passive Exposure: Not on file   Housing/Utilities: Unknown   ??? Within the past 12 months, have you ever stayed: outside, in a car, in a tent, in an overnight shelter, or temporarily in someone else's home (i.e. couch-surfing)?: No   ??? Are you worried about losing your housing?: Not on file   ??? Within the past 12 months, have you been unable to get utilities (heat, electricity) when it was really needed?: Not on file   Alcohol Use: Not on file   Transportation Needs: Not on file   Substance Use: Not on file   Health Literacy: Medium Risk   ??? : Sometimes   Physical Activity: Not on file   Interpersonal Safety: Not on file   Stress: Not on file   Intimate Partner Violence: Not on file   Depression: Not at risk   ??? PHQ-2 Score: 0   Social Connections: Not on file       Would you be willing to receive help with any of the needs that you have identified today? Not applicable       SHIPPING     Specialty Medication(s) to be Shipped:   Neurology: glatiramer    Other medication(s) to be shipped: No additional medications requested for fill at this time     Changes to insurance: No    Delivery Scheduled: Yes, Expected medication delivery date: 4/24.     Medication will be delivered via Same Day Courier to the confirmed prescription address in [facility].    The patient will receive a drug information handout for each medication shipped and additional FDA Medication Guides as required.  Verified that patient has previously received a Conservation officer, historic buildings and a Surveyor, mining.    The patient or caregiver noted above participated in the development of this care plan and knows that they can request review of or adjustments to the care plan at any time.      All of the patient's questions and concerns have been addressed.    Clydell Hakim   [facility] Specialty Pharmacist

## 2021-10-22 MED FILL — GLATIRAMER 40 MG/ML SUBCUTANEOUS SYRINGE: SUBCUTANEOUS | 84 days supply | Qty: 36 | Fill #1

## 2021-10-29 ENCOUNTER — Ambulatory Visit: Admit: 2021-10-29 | Discharge: 2021-10-29

## 2021-11-13 ENCOUNTER — Ambulatory Visit: Admit: 2021-11-13 | Discharge: 2021-11-14 | Attending: Physician Assistant | Primary: Physician Assistant

## 2021-11-13 DIAGNOSIS — R5382 Chronic fatigue, unspecified: Principal | ICD-10-CM

## 2021-11-13 DIAGNOSIS — G35 Multiple sclerosis: Principal | ICD-10-CM

## 2021-11-13 MED ORDER — AMANTADINE HCL 100 MG CAPSULE
ORAL_CAPSULE | 5 refills | 0 days | Status: CP
Start: 2021-11-13 — End: ?

## 2021-11-13 NOTE — Unmapped (Signed)
Below is your visit summary:    -Continue Copaxone, glatiramer.  -Increase amantadine 100 mg to twice a day, first dose early in the morning and then again at noon.  .  **Follow up 6 months.    Navigating the Challenges of MS: MS Navigator??   National MS Society: MS Navigator at [phone]  Finding answers and making decisions relies on having the right information at the right time. MS Navigator are highly skilled, compassionate professionals available Monday through Friday, 9:00 a.m. to 7:00 p.m. ET to connect you to the information, resources and support needed to move your life forward. These supportive partners help navigate the challenges of MS unique to your situation.    Maylea Soria Jannett Celestine PA-C, MPAS  Division of Multiple Sclerosis    **NEW ADDRESS [facility] at [facility]  [address]  [place], [place] [address]    Phone [phone]  Fax [phone]    [facility] Neurology MS Clinic is a specialty clinic and there is a need for you to have a  primary care provider  who will take care of your non-neurological health.   Please set this up if you have not already done so.      In case of:  a suspected relapse (new symptoms or worsening existing symptoms, lasting for >24h)  OR  a need for an additional appointment for other reasons  OR   if you have other questions: please contact:       [facility] Desk   Phone: [phone]      Supportive Therapy, Patriciaann Clan, MSW, LCSW.        Phone [phone]    If you have questions for our  pharmacist, please call:      Worthy Flank, PharmD, CPP  Phone: [phone]      If you need financial assistance, please call:      Financial Assistance Unit      Phone: [phone]

## 2021-11-13 NOTE — Unmapped (Signed)
[facility] [place] [facility] at [facility]  Multiple Sclerosis / Neuroimmunology Division  Flemon Kelty Julieanne [facility]  Physician Assistant    Phone: [phone]  Fax: [phone]      Patient Name: Tiffany Meadows   Date of Birth: Dec 21, 1985  Medical Record Number: [id number]  [address] [place] [place]  [place] [address]     Direct entry by:  Cy Blamer, PA-C.  Supervising Physician: Dr. Desma Mcgregor, Dr. Quillian Quince.    DATE OF VISIT: Nov 13, 2021    REASON FOR VISIT: Followup in the Neuroimmunology Clinic for evaluation of Multiple Sclerosis / Demyelinating Disease / or other Neurological problem.    I personally spent 40 minutes face-to-face and non-face-to-face in the care of this patient, which includes all pre, intra, and post visit time on the date of service.  All documented time was specific to the E/M visit and does not include any procedures that may have been performed.     Last seen by Dr. Johnnye Lana 08/18/2018.    SPANISH INTERPRETOR SERVICE WAS USED via phone:    ASSESSMENT AND PLAN:  ** Possible Multiple Sclerosis.  -Glitarimer started 12/16/2017. [place].   -Reviewed  with the patient 10/29/2021 yearly MRI reports of Brain, Cervical , and Thoracic spine,  without contrast,  compared to 11/05/2020 which showed no new or enhancing lesions.     **Vitamin D:  -Continue D3 4,000 units daily.    **Fatigue:  -Increase Amantadine 100 mg from daily to BID with the second dose at noon.    **Neuropathic symptoms:  -Continue Gabapentin 300 mg at bedtime.    -Follow up in 6 months.    INTERVAL HISTORY / CHIEF COMPLAINT :  02/25/20203 ED:  Four day history of bilateral  arm numbness and tinging and pain in head with moving side to side. All symptoms were coming and going.  No weakness seen on exam. Received I gm IV methylprednisolone in the ED and oral prednisone taper 60 mg over 6 days with 100% resolution.    Takes Gabapentin 300 mg q HS for pain foot pain and arms and headaches. Prescribed initaily by OSH.    Current symptoms: (If blank =  none).  Vision/double vision:  Speech, swallowing problems:  Weakness:  Fatigue:Yes  Tingling/numbness/pain:  Balance/coordination problems:  Bowel/bladder control problems:  Memory, mood:  Gait:  Falls:  Headaches: Occasionally, none today. Take OCT medication and Gabapentin daily.  Seizures:  PHQ9 = 6, denies SI/HI.  Other symptoms:     PHYSICAL EXAMINATION:  GENERAL:  Alert and oriented to person, place, time and situation.    Recent and remote memory intact.      Neurological Examination:   Cranial Nerves:   II, III- Pupils are equal 2 mm and reactive to light b/l.  Visual Acuity:   OD 20/25, OS 20/25,-1  III, IV, VI- extra ocular movements are intact, No ptosis, no nystagmus.  V- sensation of the face intact b/l.  VIII- Hearing grossly intact.  XI- Full shoulder shrug bilaterally    Motor Exam:      Muscles UEs   LEs     R L   R L   Deltoids 5/5 5/5 Hip flexors  5/5 5/5   Biceps 5/5 5/5 Hip extensors 5/5 5/5   Triceps 5/5 5/5 Knee flexors 5/5 5/5   Hand grip 5/5 5/5 Knee extensors 5/5 5/5      Foot dorsal flexors  5/5 5/5      Foot plantar flexors 5/5 5/5      Normal bulk and tone.  No clonus.    Reflexes R L   Brachioradialis +2 +2   Biceps +2 +2   Triceps +2 +2   Patella +2 +2   Achilles +1 +1     Negative babinski.    Sensory UEs LEs    R L R L   Light touch WNL WNL WNL WNL   Pin prick WNL WNL WNL WNL   Vibration WNL WNL WNL WNL   Proprioception   WNL WNL        Cerebellar/Coordination:  Rapid alternating movements, finger-to-nose and heel-to-shin  bilaterally demonstrates no abnormalities.    Romberg was negative with eyes closed.  Gait: Normal stride and arm swing. Can tandem gait and walk on heels and toes.    PRIOR HISTORY: A 36 y.o. female with no PMH who presented in the first week of 03/2017 with vision loss bilaterally for one week with eye pain and abnormal MRI that showed bilateral optic neuritis. The patient was placed on IV Solu-Medrol. MRI of the brain was concerning for possible demyelinating disease. The patient was seen in consultation by [place] neurology who recommended continuing IV solumedrol.   Her vision improved significantly with IV 3 doses of Solu-Medrol. She denies prior neurological episodes and her vision recovered completely. Continues to have  residual left leg hypesthesia.   She denies miscarriages, hiccups and vomiting episodes for no clear reason.     Her diagnosis is possible MS: She is NMO-IgG and MOG-IgG negative.  Her CSF studies show mild pleocytosis with no IgG OCBs. An autoimmune panel to screen for other conditions that may give bilateral optic neuritis was unremarkable.  She has no spinal cord lesions and one small subcortical and non -specific brain MRI lesion.     The patient states she dos not qualify for [facility] and for financial reasons therefore only performed brain, C/T spine MRI at the next MRI follow-up.      Diagnoses with Sacroiliitis.   Xrays showed Mild osteoarthrosis of the pubic symphysis and Mild facet joint narrowing and sclerosis is present at L4-5 and L5-S1 bilaterally.    DMD History:  Glatiramer acetate:  12/26/2017-ongoing      MRI: REPORTS:  11/05/2020 yearly MRI reports of Brain, Cervical  and Thoracic spine,  with / without contrast,  compared to 09/14/2019  which showed no changes in the brain and no spinal cord lesions.    09/04/2018 MRI of the brain with / without contrast. report compared to 04/03/2018: increased T2/FLAIR signal. Several of these lesions were seen on the preceding exam, but some more not. The previous exam demonstrated significant motion. Therefore, I am uncertain if there are new lesions or just poorly visualized lesions previously. Recommend follow-up for further evaluation, and consider sedation for future exams.   No enhancing lesions are seen. Increased T2 signal within the retrobulbar and in the canalicular segments of the bilateral optic nerves. No demonstrable enhancement on postcontrast images.     04/03/2017: Brain MRI with and w/o contrast, report: 1. Bilateral optic neuritis.  2. Single FLAIR hyperintense focus superficial left frontal white matter. While appearance is nonspecific, presence of a white matter lesion in the setting of optic neuritis raises concern for demyelinating disease.    07/10/2017: C/T  spine MRI with and w/o contrast, report: --No convincing evidence of demyelinating lesions within the cervical or thoracic spinal  cord. --Mild degenerative disc disease at C5-6 and C6-7 without high-grade canal or neural foraminal stenosis.--Bone marrow signal is diffusely mildly T1-hypointense, possibly reflective of anemia.     Lumbar puncture:   IgG, CSF 04/05/2017 2.4  0.5 - 7.0 mg/dL Final    Albumin, CSF 16/04/9603 17.1  5.0 - 35.7 mg/dL Final    Albumin, Serum 04/04/2017 4400  3,500-5,000 mg/dL Final    CSF IGG Index 04/04/2017 0.5  0.3 - 0.8 Final    CSF Oligoclonal Bands 04/04/2017  negative negative Final    Ratio Interpretation 04/04/2017  normal   Final    Albumin 04/04/2017 4.4  3.5 - 5.0 g/dL Final    Total IgG 54/03/8118 1201  600-1,700 mg/dL Final    Tube # CSF 14/78/2956 Tube 1    Final    Color, CSF 04/05/2017 Colorless    Final    Appearance, CSF 04/05/2017 Clear    Final    Nucleated Cells, CSF 04/05/2017 8* 0 - 5 ul Final    RBC, CSF 04/05/2017 58* 0 ul Final    Neutrophil %, CSF 04/05/2017 1.0  0.0 - 6.0 % Final    Lymphs %, CSF 04/05/2017 85.0* 40.0 - 80.0 % Final    Mono/Macrophage %, CSF 04/05/2017 14.0* 15.0 - 45.0 % Final    Tube # CSF 04/05/2017 Tube 4    Final    Color, CSF 04/05/2017 Colorless    Final    Appearance, CSF 04/05/2017 Clear    Final    Nucleated Cells, CSF 04/05/2017 13* 0 - 5 ul Final    RBC, CSF 04/05/2017 2* 0 ul Final    Lymphs %, CSF 04/05/2017 98.0* 40.0 - 80.0 % Final    Mono/Macrophage %, CSF 04/05/2017 2.0* 15.0 - 45.0 % Final    Protein, CSF 04/05/2017 29  15 - 45 mg/dL Final    Glucose, CSF 21/30/8657 86* 40 - 70 mg/dL Final    CSF cytology 84/69/6295 -Mild inflammatory pleocytosis, negative for malignant cells.   Final        REVIEW OF SYSTEMS:  A 10-systems review was performed and, unless otherwise noted, declared negative by patient.    Current Outpatient Medications   Medication Sig Dispense Refill    amantadine HCL (SYMMETREL) 100 mg capsule TAKE 1 CAPSULE(100 MG) BY MOUTH EVERY MORNING 90 capsule 1    cholecalciferol, vitamin D3, 4,000 unit cap Take by mouth.      etonogestreL (NEXPLANON) 68 mg Impl 1 each (68 mg total) by Subdermal route once.      gabapentin (NEURONTIN) 300 MG capsule Take 1 capsule (300 mg total) by mouth at bedtime. 30 capsule 11    glatiramer 40 mg/mL Syrg Inject the contents of 1 syringe (40 mg total) under the skin 3 (three) times a week. 36 mL 1    metFORMIN (GLUCOPHAGE) 500 MG tablet 2 pills AM and 3 pills PM, take with food (Spanish) 450 tablet 3     No current facility-administered medications for this visit.       Patient Active Problem List   Diagnosis    Bilateral optic neuritis    Abnormal brain MRI    Vitamin D deficiency    Elevated LFTs    History of gestational diabetes mellitus (GDM)    Illiteracy    Overweight    Fatty liver    Neuropathic pain    Multiple sclerosis (CMS-HCC)    Chronic fatigue    Arthralgia  Type 2 diabetes mellitus without complication, without long-term current use of insulin (CMS-HCC)    Health care maintenance       No Known Allergies    Social History     Socioeconomic History    Marital status: Married   Tobacco Use    Smoking status: Never    Smokeless tobacco: Never   Substance and Sexual Activity    Alcohol use: No    Drug use: No    Sexual activity: Yes     Partners: Male

## 2022-01-23 DIAGNOSIS — H469 Unspecified optic neuritis: Principal | ICD-10-CM

## 2022-01-23 MED ORDER — GLATIRAMER 40 MG/ML SUBCUTANEOUS SYRINGE
SUBCUTANEOUS | 1 refills | 84 days
Start: 2022-01-23 — End: ?

## 2022-01-23 NOTE — Unmapped (Signed)
Pt  Has  Enough  Medication  On  Hand  For  A  Month   -  Rescheduling  Refill  Call  For  2 weeks  Out

## 2022-01-24 NOTE — Unmapped (Signed)
Request received via interface.     Provider: Yolande Jolly    Last Visit Date: 11/13/2021    Next Visit Date: 05/16/2022    Lab Results   Component Value Date    Hep B Surface Ag Nonreactive 08/06/2017    Hep B S Ab Nonreactive (A) 11/21/2017    Hep B Core Total Ab Nonreactive 08/06/2017    Hepatitis C Ab Nonreactive 08/06/2017        Results for orders placed during the hospital encounter of 10/29/21    MRI Brain Wo Contrast    Narrative  EXAM: Magnetic resonance imaging, brain, without contrast material.  DATE: 10/29/2021 10:28 AM  ACCESSION: [id number] UN  DICTATED: 10/29/2021 10:47 AM  INTERPRETATION LOCATION: [facility]    CLINICAL INDICATION: 36 years old Female with MS ; Multiple sclerosis, monitor  - H46.9 - Bilateral optic neuritis - G37.9 - Demyelinating disease (CMS - HCC)    COMPARISON: MRI brain dated 11/05/2020    TECHNIQUE: Multiplanar, multisequence MR imaging of the brain was performed without I.V. contrast.    FINDINGS:  A few unchanged small foci of FLAIR signal abnormality in the frontal lobe subcortical white matter bilaterally (17:36, 55, 95). Some of the lesions demonstrate corresponding T1 black holes consistent with axonal loss (for example, 13:57, 101). No new lesions.    Redemonstrated high T2 signal surrounding the optic nerves bilaterally, unchanged, compatible with optic nerve atrophy. No diffuse brain volume loss. Ventricles are normal in size.    There is no evidence of intracranial hemorrhage, acute infarct, or mass.    Impression  Unchanged small lesions in the bilateral frontal lobe white matter. No new lesions.    Sequela of bilateral optic neuritis, unchanged.      Results for orders placed during the hospital encounter of 10/29/21    MRI Thoracic Spine Wo Contrast    Narrative  EXAM: Magnetic resonance imaging, spinal canal and contents, thoracic, without contrast material.  DATE: 10/29/2021 10:28 AM  ACCESSION: [id number] UN  DICTATED: 10/29/2021 10:48 AM  INTERPRETATION LOCATION: [facility]    CLINICAL INDICATION: 36 years old Female with MS ; Demyelinating disease  - H46.9 - Bilateral optic neuritis - G37.9 - Demyelinating disease (CMS - HCC)    COMPARISON: MRI thoracic spine 11/05/2020, same-day MRI cervical spine    TECHNIQUE: Multiplanar MRI was performed through the thoracic spine without contrast administration    FINDINGS:  Unchanged foci of T1/T2 hyperintensity in the T12 and L1 vertebral bodies, likely representing focal fat or intraosseous hemangiomas. Normal signal in the spinal cord without suspicious lesions.    The vertebral bodies are normally aligned. Disc spaces are preserved. No significant spinal canal or neural foraminal narrowing.    The paraspinal tissues are within normal limits.    Impression  No evidence of demyelination in the thoracic spine.      Results for orders placed during the hospital encounter of 10/29/21    MRI Cervical Spine Wo Contrast    Narrative  EXAM: Magnetic resonance imaging, spinal canal and contents, cervical without contrast material.  DATE: 10/29/2021 10:28 AM  ACCESSION: [id number] UN  DICTATED: 10/29/2021 10:55 AM  INTERPRETATION LOCATION: [facility]    CLINICAL INDICATION: 36 years old Female with MS ; Demyelinating disease  - H46.9 - Bilateral optic neuritis - G37.9 - Demyelinating disease (CMS - HCC)    COMPARISON: Same day MRI of the brain and thoracic spine    TECHNIQUE: Multiplanar multisequence MRI was performed through the  cervical spine without intravenous contrast.    FINDINGS:  Normal signal in the spinal cord.    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 The vertebral bodies are normally aligned. Multilevel disc desiccation without significant height loss. Multilevel disc bulges and central disc protrusions from C4-C7, unchanged. No significant spinal canal or neural foraminal narrowing.    The paraspinal tissues are within normal limits.    Impression  -No cervical cord signal abnormality.  -Mild spondylosis of the cervical spine, unchanged.

## 2022-01-25 MED ORDER — GLATIRAMER 40 MG/ML SUBCUTANEOUS SYRINGE
SUBCUTANEOUS | 1 refills | 84 days | Status: CP
Start: 2022-01-25 — End: ?
  Filled 2022-02-14: qty 36, 84d supply, fill #0

## 2022-02-11 NOTE — Unmapped (Signed)
[facility] Refill Coordination Note    Specialty Medication(s) to be Shipped:   Neurology: glatiramer    Other medication(s) to be shipped: No additional medications requested for fill at this time     Tiffany Meadows, DOB: 1986-06-06  Phone: [phone] (home)       All above HIPAA information was verified with patient.     Was a Nurse, learning disability used for this call? No    Completed refill call assessment today to schedule patient's medication shipment from the [facility] [phone]).  All relevant notes have been reviewed.     Specialty medication(s) and dose(s) confirmed: Regimen is correct and unchanged.   Changes to medications: Daisia reports no changes at this time.  Changes to insurance: No  New side effects reported not previously addressed with a pharmacist or physician: None reported  Questions for the pharmacist: No    Confirmed patient received a Conservation officer, historic buildings and a Surveyor, mining with first shipment. The patient will receive a drug information handout for each medication shipped and additional FDA Medication Guides as required.       DISEASE/MEDICATION-SPECIFIC INFORMATION        For patients on injectable medications: Patient currently has 4 doses left.  Next injection is scheduled for 02/11/22.    SPECIALTY MEDICATION ADHERENCE     Medication Adherence    Patient reported X missed doses in the last month: 0  Specialty Medication: glatiramer 40mg /ml  Patient is on additional specialty medications: No  Patient is on more than two specialty medications: No  Informant: patient  Reliability of informant: reliable  Provider-estimated medication adherence level: good  Reasons for non-adherence: no problems identified                                Were doses missed due to medication being on hold? No    glatiramer 40 mg/mL Syrg  : 7 days of medicine on hand        REFERRAL TO PHARMACIST     Referral to the pharmacist: Not needed      [facility] Shipping address confirmed in Epic.     Delivery Scheduled: Yes, Expected medication delivery date: 02/14/22.     Medication will be delivered via Same Day Courier to the prescription address in Epic WAM.    Saad Buhl' W Wilhemena Durie Shared [facility] Specialty Technician

## 2022-03-07 DIAGNOSIS — G35 Multiple sclerosis: Principal | ICD-10-CM

## 2022-03-21 NOTE — Unmapped (Signed)
BUMP - LVM/MyChart/Text to r/s 05/16/22 appt. with Clara

## 2022-03-22 NOTE — Unmapped (Signed)
9/22-lvm-regarding 11/16 appt w/zelasky needs to be reschd.

## 2022-05-03 NOTE — Unmapped (Signed)
[facility] Refill Coordination Note    Specialty Medication(s) to be Shipped:   Neurology: glatiramer    Other medication(s) to be shipped: No additional medications requested for fill at this time     Tiffany Meadows, DOB: [birth year]  Phone: [phone] (home)       All above HIPAA information was verified with patient.     Was a Nurse, learning disability used for this call? No    Completed refill call assessment today to schedule patient's medication shipment from the [facility] [phone]).  All relevant notes have been reviewed.     Specialty medication(s) and dose(s) confirmed: Regimen is correct and unchanged.   Changes to medications: Tiffany Meadows reports no changes at this time.  Changes to insurance: No  New side effects reported not previously addressed with a pharmacist or physician: None reported  Questions for the pharmacist: No    Confirmed patient received a Conservation officer, historic buildings and a Surveyor, mining with first shipment. The patient will receive a drug information handout for each medication shipped and additional FDA Medication Guides as required.       DISEASE/MEDICATION-SPECIFIC INFORMATION        For patients on injectable medications: Patient currently has 0 doses left.  Next injection is scheduled for 05/06/22.    SPECIALTY MEDICATION ADHERENCE     Medication Adherence    Patient reported X missed doses in the last month: 0  Specialty Medication: glatiramer 40 mg/mL  Patient is on additional specialty medications: No                                Were doses missed due to medication being on hold? No    glatiramer 40 mg/mL Syrg  : 0 days of medicine on hand        REFERRAL TO PHARMACIST     Referral to the pharmacist: Not needed      [facility]     Shipping address confirmed in Epic.     Delivery Scheduled: Yes, Expected medication delivery date: 05/06/22.     Medication will be delivered via Same Day Courier to the prescription address in Epic WAM.    Tiffany Meadows   [facility] Specialty Technician

## 2022-05-06 MED FILL — GLATIRAMER 40 MG/ML SUBCUTANEOUS SYRINGE: SUBCUTANEOUS | 84 days supply | Qty: 36 | Fill #1

## 2022-08-01 DIAGNOSIS — H469 Unspecified optic neuritis: Principal | ICD-10-CM

## 2022-08-01 NOTE — Unmapped (Signed)
[facility] Refill Coordination Note    Specialty Medication(s) to be Shipped:   Neurology: glatiramer    Other medication(s) to be shipped: No additional medications requested for fill at this time     Tiffany Meadows, DOB: Dec 10, 1985  Phone: [phone] (home)       All above HIPAA information was verified with patient's family member, son.     Was a Nurse, learning disability used for this call? No    Completed refill call assessment today to schedule patient's medication shipment from the [facility] [phone]).  All relevant notes have been reviewed.     Specialty medication(s) and dose(s) confirmed: Regimen is correct and unchanged.   Changes to medications: Kaizley reports no changes at this time.  Changes to insurance: No  New side effects reported not previously addressed with a pharmacist or physician: None reported  Questions for the pharmacist: No    Confirmed patient received a Conservation officer, historic buildings and a Surveyor, mining with first shipment. The patient will receive a drug information handout for each medication shipped and additional FDA Medication Guides as required.       DISEASE/MEDICATION-SPECIFIC INFORMATION        For patients on injectable medications: Patient currently has 4 doses left.  Next injection is scheduled for 08/02/22.    SPECIALTY MEDICATION ADHERENCE     Medication Adherence    Patient reported X missed doses in the last month: 0  Specialty Medication: glatiramer 40 mg/mL Syrg  Patient is on additional specialty medications: No                                Were doses missed due to medication being on hold? No        REFERRAL TO PHARMACIST     Referral to the pharmacist: Not needed      [facility]     Shipping address confirmed in Epic.     Delivery Scheduled: Yes, Expected medication delivery date: 08/07/22.     Medication will be delivered via Same Day Courier to the prescription address in Epic WAM.    Quintella Reichert   [facility] Specialty Technician

## 2022-08-02 MED ORDER — GLATIRAMER 40 MG/ML SUBCUTANEOUS SYRINGE
SUBCUTANEOUS | 0 refills | 84.00000 days | Status: CP
Start: 2022-08-02 — End: ?
  Filled 2022-08-07: qty 36, 84d supply, fill #0

## 2022-08-02 NOTE — Unmapped (Signed)
Request received via interface.     Provider: Yolande Jolly, PA    Last Visit Date: 11/13/2021  Next Visit Date: 10/30/2022    Lab Results   Component Value Date    Hep B Surface Ag Nonreactive 08/06/2017    Hep B S Ab Nonreactive (A) 11/21/2017    Hep B Core Total Ab Nonreactive 08/06/2017    Hepatitis C Ab Nonreactive 08/06/2017        Results for orders placed during the hospital encounter of 10/29/21    MRI Brain Wo Contrast    Narrative  EXAM: Magnetic resonance imaging, brain, without contrast material.  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 contrast.    FINDINGS:  A few unchanged small foci of FLAIR signal abnormality in the frontal lobe subcortical white matter bilaterally (17:36, 55, 95). Some of the lesions demonstrate corresponding T1 black holes consistent with axonal loss (for example, 13:57, 101). No new lesions.    Redemonstrated high T2 signal surrounding the optic nerves bilaterally, unchanged, compatible with optic nerve atrophy. No diffuse brain volume loss. Ventricles are normal in size.    There is no evidence of intracranial hemorrhage, acute infarct, or mass.    Impression  Unchanged small lesions in the bilateral frontal lobe white matter. No new lesions.    Sequela of bilateral optic neuritis, unchanged.      Results for orders placed during the hospital encounter of 10/29/21    MRI Thoracic Spine Wo Contrast    Narrative  EXAM: Magnetic resonance imaging, spinal canal and contents, thoracic, without contrast material.  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 Normal signal in the spinal cord without suspicious lesions.    The vertebral bodies are normally aligned. Disc spaces are preserved. No significant spinal canal or neural foraminal narrowing.    The paraspinal tissues are within normal limits.    Impression  No evidence of demyelination in the thoracic spine.      Results for orders placed during the hospital encounter of 10/29/21    MRI Cervical Spine Wo Contrast    Narrative  EXAM: Magnetic resonance imaging, spinal canal and contents, cervical without contrast material.  DATE: 10/29/2021 10:28 AM  ACCESSION: [id number] UN  DICTATED: 10/29/2021 10:55 AM  INTERPRETATION LOCATION: 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    FINDINGS:  Normal signal in the spinal cord.    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 The vertebral bodies are normally aligned. Multilevel disc desiccation without significant height loss. Multilevel disc bulges and central disc protrusions from C4-C7, unchanged. No significant spinal canal or neural foraminal narrowing.    The paraspinal tissues are within normal limits.    Impression  -No cervical cord signal abnormality.  -Mild spondylosis of the cervical spine, unchanged.

## 2022-08-29 DIAGNOSIS — H469 Unspecified optic neuritis: Principal | ICD-10-CM

## 2022-08-29 NOTE — Unmapped (Signed)
Last Visit Date: 11/13/2021  Next Visit Date: 10/30/2022  Last seen by [facility]  Lab Results   Component Value Date    Hep B Surface Ag Nonreactive 08/06/2017    Hep B S Ab Nonreactive (A) 11/21/2017    Hep B Core Total Ab Nonreactive 08/06/2017    Hepatitis C Ab Nonreactive 08/06/2017        Results for orders placed during the hospital encounter of 10/29/21    MRI Brain Wo Contrast    Narrative  EXAM: Magnetic resonance imaging, brain, without contrast material.  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 contrast.    FINDINGS:  A few unchanged small foci of FLAIR signal abnormality in the frontal lobe subcortical white matter bilaterally (17:36, 55, 95). Some of the lesions demonstrate corresponding T1 black holes consistent with axonal loss (for example, 13:57, 101). No new lesions.    Redemonstrated high T2 signal surrounding the optic nerves bilaterally, unchanged, compatible with optic nerve atrophy. No diffuse brain volume loss. Ventricles are normal in size.    There is no evidence of intracranial hemorrhage, acute infarct, or mass.    Impression  Unchanged small lesions in the bilateral frontal lobe white matter. No new lesions.    Sequela of bilateral optic neuritis, unchanged.      Results for orders placed during the hospital encounter of 10/29/21    MRI Thoracic Spine Wo Contrast    Narrative  EXAM: Magnetic resonance imaging, spinal canal and contents, thoracic, without contrast material.  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 Normal signal in the spinal cord without suspicious lesions.    The vertebral bodies are normally aligned. Disc spaces are preserved. No significant spinal canal or neural foraminal narrowing.    The paraspinal tissues are within normal limits.    Impression  No evidence of demyelination in the thoracic spine.      Results for orders placed during the hospital encounter of 10/29/21    MRI Cervical Spine Wo Contrast    Narrative  EXAM: Magnetic resonance imaging, spinal canal and contents, cervical without contrast material.  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    FINDINGS:  Normal signal in  the spinal cord.    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 The vertebral bodies are normally aligned. Multilevel disc desiccation without significant height loss. Multilevel disc bulges and central disc protrusions from C4-C7, unchanged. No significant spinal canal or neural foraminal narrowing.    The paraspinal tissues are within normal limits.    Impression  -No cervical cord signal abnormality.  -Mild spondylosis of the cervical spine, unchanged.

## 2022-08-30 MED ORDER — GLATIRAMER 40 MG/ML SUBCUTANEOUS SYRINGE
SUBCUTANEOUS | 0 refills | 84 days | Status: CP
Start: 2022-08-30 — End: ?

## 2022-09-20 DIAGNOSIS — H469 Unspecified optic neuritis: Principal | ICD-10-CM

## 2022-09-20 DIAGNOSIS — G35 Multiple sclerosis: Principal | ICD-10-CM

## 2022-09-20 MED ORDER — GLATIRAMER 40 MG/ML SUBCUTANEOUS SYRINGE
SUBCUTANEOUS | 0 refills | 84 days | Status: CP
Start: 2022-09-20 — End: ?

## 2022-09-25 NOTE — Unmapped (Signed)
Incoming vm left on CMA triage line from [facility] requesting a prescription for a Whisperject Auto Injector to go with her Glatiramer. Please send electronically if appropriate.

## 2022-09-26 NOTE — Unmapped (Signed)
Voice message was left from [facility] (Diana)for prescription a Whisper ject Auto injection  call back number [phone]

## 2022-09-30 NOTE — Unmapped (Signed)
Tiffany Meadows from [facility] Patient Assistance called reporting that she has tried three times but been unable to reach the patient. She is requesting a call back or for [place] to contact the patient. Routed to pharmacy team.

## 2022-09-30 NOTE — Unmapped (Signed)
Called verbal rx in for Whisperject to [facility].    Worthy Flank, PharmD, CPP  Clinical Pharmacist, [facility]  Phone: [phone]

## 2022-10-11 NOTE — Unmapped (Signed)
Specialty Medication(s): glatiramer    [name] has been dis-enrolled from the [facility] specialty pharmacy services due to a pharmacy change resulting from insurance limitations. The insurance company requires the patient fill at [facility] .    Additional information provided to the patient: n/a    Arnold Long, PharmD  [facility] Specialty Pharmacist

## 2022-10-28 DIAGNOSIS — M792 Neuralgia and neuritis, unspecified: Principal | ICD-10-CM

## 2022-10-28 MED ORDER — GABAPENTIN 300 MG CAPSULE
ORAL_CAPSULE | 0 refills | 0 days | Status: CP
Start: 2022-10-28 — End: ?

## 2022-11-05 DIAGNOSIS — E119 Type 2 diabetes mellitus without complications: Principal | ICD-10-CM

## 2022-12-16 ENCOUNTER — Ambulatory Visit: Admit: 2022-12-16 | Discharge: 2022-12-17 | Payer: PRIVATE HEALTH INSURANCE

## 2022-12-16 MED ORDER — GABAPENTIN 300 MG CAPSULE
ORAL_CAPSULE | Freq: Every evening | ORAL | 3 refills | 90.00000 days | Status: CP
Start: 2022-12-16 — End: 2023-12-16

## 2022-12-16 MED ORDER — METFORMIN 500 MG TABLET
ORAL_TABLET | 3 refills | 0 days | Status: CP
Start: 2022-12-16 — End: 2022-12-16

## 2022-12-16 MED ORDER — AMANTADINE HCL 100 MG CAPSULE
ORAL_CAPSULE | Freq: Every day | ORAL | 3 refills | 90.00000 days | Status: CP
Start: 2022-12-16 — End: 2023-12-16

## 2022-12-16 MED ORDER — GLATIRAMER 40 MG/ML SUBCUTANEOUS SYRINGE
SUBCUTANEOUS | 3 refills | 84.00000 days | Status: CP
Start: 2022-12-16 — End: 2022-12-16

## 2022-12-16 MED ORDER — METFORMIN 1,000 MG TABLET
ORAL_TABLET | Freq: Two times a day (BID) | ORAL | 3 refills | 90 days | Status: CP
Start: 2022-12-16 — End: 2023-12-16

## 2022-12-18 DIAGNOSIS — H469 Unspecified optic neuritis: Principal | ICD-10-CM

## 2022-12-18 DIAGNOSIS — G35 Multiple sclerosis: Principal | ICD-10-CM

## 2022-12-18 MED ORDER — GLATIRAMER 40 MG/ML SUBCUTANEOUS SYRINGE
SUBCUTANEOUS | 3 refills | 84 days | Status: CP
Start: 2022-12-18 — End: ?

## 2023-01-07 ENCOUNTER — Ambulatory Visit
Admit: 2023-01-07 | Discharge: 2023-01-08 | Payer: PRIVATE HEALTH INSURANCE | Attending: Physician Assistant | Primary: Physician Assistant

## 2023-01-07 DIAGNOSIS — G35 Multiple sclerosis: Principal | ICD-10-CM

## 2023-01-07 DIAGNOSIS — E559 Vitamin D deficiency, unspecified: Principal | ICD-10-CM

## 2023-01-07 DIAGNOSIS — H469 Unspecified optic neuritis: Principal | ICD-10-CM

## 2023-01-07 DIAGNOSIS — R5382 Chronic fatigue, unspecified: Principal | ICD-10-CM

## 2023-01-07 DIAGNOSIS — M792 Neuralgia and neuritis, unspecified: Principal | ICD-10-CM

## 2023-01-12 MED ORDER — CHOLECALCIFEROL (VITAMIN D3) 1,250 MCG (50,000 UNIT) CAPSULE
ORAL_CAPSULE | ORAL | 0 refills | 168 days | Status: CP
Start: 2023-01-12 — End: ?

## 2023-01-13 ENCOUNTER — Ambulatory Visit: Admit: 2023-01-13 | Discharge: 2023-01-14 | Payer: PRIVATE HEALTH INSURANCE

## 2023-01-13 DIAGNOSIS — R590 Localized enlarged lymph nodes: Principal | ICD-10-CM

## 2023-01-17 ENCOUNTER — Ambulatory Visit: Admit: 2023-01-17 | Discharge: 2023-01-18 | Payer: PRIVATE HEALTH INSURANCE

## 2023-02-26 ENCOUNTER — Ambulatory Visit
Admit: 2023-02-26 | Discharge: 2023-02-27 | Payer: PRIVATE HEALTH INSURANCE | Attending: Student in an Organized Health Care Education/Training Program | Primary: Student in an Organized Health Care Education/Training Program

## 2023-02-26 MED ORDER — RYBELSUS 3 MG TABLET
ORAL_TABLET | Freq: Every day | ORAL | 0 refills | 30 days | Status: CP
Start: 2023-02-26 — End: 2023-02-26

## 2023-05-26 ENCOUNTER — Ambulatory Visit
Admit: 2023-05-26 | Discharge: 2023-05-27 | Payer: PRIVATE HEALTH INSURANCE | Attending: Retina Specialist | Primary: Retina Specialist

## 2023-05-26 DIAGNOSIS — E119 Type 2 diabetes mellitus without complications: Principal | ICD-10-CM

## 2023-05-27 ENCOUNTER — Ambulatory Visit
Admit: 2023-05-27 | Discharge: 2023-05-28 | Payer: PRIVATE HEALTH INSURANCE | Attending: Student in an Organized Health Care Education/Training Program | Primary: Student in an Organized Health Care Education/Training Program

## 2023-08-29 ENCOUNTER — Ambulatory Visit
Admit: 2023-08-29 | Discharge: 2023-08-30 | Payer: PRIVATE HEALTH INSURANCE | Attending: Student in an Organized Health Care Education/Training Program | Primary: Student in an Organized Health Care Education/Training Program

## 2023-08-29 MED ORDER — RYBELSUS 3 MG TABLET
ORAL_TABLET | Freq: Every day | ORAL | 0 refills | 30.00 days | Status: CP
Start: 2023-08-29 — End: 2023-09-28

## 2023-09-28 MED ORDER — RYBELSUS 7 MG TABLET
ORAL_TABLET | Freq: Every day | ORAL | 0 refills | 0.00 days | Status: CP
Start: 2023-09-28 — End: 2023-11-27

## 2023-11-28 ENCOUNTER — Ambulatory Visit
Admit: 2023-11-28 | Discharge: 2023-11-29 | Payer: PRIVATE HEALTH INSURANCE | Attending: Student in an Organized Health Care Education/Training Program | Primary: Student in an Organized Health Care Education/Training Program

## 2023-11-28 MED ORDER — GLATIRAMER 40 MG/ML SUBCUTANEOUS SYRINGE
SUBCUTANEOUS | 3 refills | 84.00000 days | Status: CP
Start: 2023-11-28 — End: ?

## 2023-11-28 MED ORDER — METFORMIN 1,000 MG TABLET
ORAL_TABLET | Freq: Two times a day (BID) | ORAL | 3 refills | 90.00000 days | Status: CP
Start: 2023-11-28 — End: 2024-11-27

## 2023-11-28 MED ORDER — AMANTADINE HCL 100 MG CAPSULE
ORAL_CAPSULE | Freq: Every day | ORAL | 3 refills | 90.00000 days | Status: CP
Start: 2023-11-28 — End: 2024-11-27

## 2023-11-28 MED ORDER — GABAPENTIN 300 MG CAPSULE
ORAL_CAPSULE | Freq: Every evening | ORAL | 3 refills | 90.00000 days | Status: CP
Start: 2023-11-28 — End: 2024-11-27

## 2024-06-03 ENCOUNTER — Encounter
Admit: 2024-06-03 | Discharge: 2024-06-03 | Payer: PRIVATE HEALTH INSURANCE | Attending: Student in an Organized Health Care Education/Training Program | Primary: Student in an Organized Health Care Education/Training Program

## 2024-06-03 DIAGNOSIS — M792 Neuralgia and neuritis, unspecified: Principal | ICD-10-CM

## 2024-06-03 DIAGNOSIS — H469 Unspecified optic neuritis: Principal | ICD-10-CM

## 2024-06-03 DIAGNOSIS — G35D Multiple sclerosis: Principal | ICD-10-CM

## 2024-06-03 DIAGNOSIS — R5382 Chronic fatigue, unspecified: Principal | ICD-10-CM

## 2024-06-03 MED ORDER — AMANTADINE HCL 100 MG CAPSULE
ORAL_CAPSULE | Freq: Every day | ORAL | 3 refills | 90.00000 days | Status: CP
Start: 2024-06-03 — End: 2025-06-03

## 2024-06-03 MED ORDER — GABAPENTIN 300 MG CAPSULE
ORAL_CAPSULE | Freq: Every evening | ORAL | 3 refills | 90.00000 days | Status: CP
Start: 2024-06-03 — End: 2025-06-03

## 2024-06-04 MED ORDER — GLATIRAMER 40 MG/ML SUBCUTANEOUS SYRINGE
SUBCUTANEOUS | 3 refills | 84.00000 days | Status: CP
Start: 2024-06-04 — End: ?

## 2024-06-11 MED ORDER — ERGOCALCIFEROL (VITAMIN D2) 1,250 MCG (50,000 UNIT) CAPSULE
ORAL_CAPSULE | ORAL | 0 refills | 168.00000 days | Status: CP
Start: 2024-06-11 — End: 2024-11-20

## 2024-08-03 DIAGNOSIS — E119 Type 2 diabetes mellitus without complications: Principal | ICD-10-CM

## 2024-08-03 MED ORDER — METFORMIN 1,000 MG TABLET
ORAL_TABLET | Freq: Two times a day (BID) | ORAL | 3 refills | 90.00000 days | Status: CP
Start: 2024-08-03 — End: ?
